# Patient Record
Sex: Male | Born: 1950 | Race: White | Hispanic: No | Marital: Married | Smoking: Never smoker
Health system: Southern US, Community
[De-identification: ages and names within clinical notes are randomized; demographics above are authoritative.]

## PROBLEM LIST (undated history)

## (undated) DIAGNOSIS — G4733 Obstructive sleep apnea (adult) (pediatric): Secondary | ICD-10-CM

## (undated) DIAGNOSIS — E785 Hyperlipidemia, unspecified: Secondary | ICD-10-CM

## (undated) DIAGNOSIS — M1990 Unspecified osteoarthritis, unspecified site: Secondary | ICD-10-CM

## (undated) DIAGNOSIS — I10 Essential (primary) hypertension: Secondary | ICD-10-CM

## (undated) DIAGNOSIS — Z8546 Personal history of malignant neoplasm of prostate: Secondary | ICD-10-CM

## (undated) DIAGNOSIS — N200 Calculus of kidney: Secondary | ICD-10-CM

## (undated) DIAGNOSIS — F5104 Psychophysiologic insomnia: Secondary | ICD-10-CM

## (undated) DIAGNOSIS — F329 Major depressive disorder, single episode, unspecified: Secondary | ICD-10-CM

## (undated) DIAGNOSIS — Z8719 Personal history of other diseases of the digestive system: Secondary | ICD-10-CM

## (undated) DIAGNOSIS — F419 Anxiety disorder, unspecified: Secondary | ICD-10-CM

## (undated) DIAGNOSIS — E119 Type 2 diabetes mellitus without complications: Secondary | ICD-10-CM

## (undated) DIAGNOSIS — E7800 Pure hypercholesterolemia, unspecified: Secondary | ICD-10-CM

## (undated) DIAGNOSIS — F32A Depression, unspecified: Secondary | ICD-10-CM

## (undated) DIAGNOSIS — F988 Other specified behavioral and emotional disorders with onset usually occurring in childhood and adolescence: Secondary | ICD-10-CM

## (undated) DIAGNOSIS — Z9989 Dependence on other enabling machines and devices: Principal | ICD-10-CM

## (undated) DIAGNOSIS — K5790 Diverticulosis of intestine, part unspecified, without perforation or abscess without bleeding: Secondary | ICD-10-CM

## (undated) DIAGNOSIS — G629 Polyneuropathy, unspecified: Secondary | ICD-10-CM

## (undated) DIAGNOSIS — M797 Fibromyalgia: Secondary | ICD-10-CM

## (undated) HISTORY — DX: Diverticulosis of intestine, part unspecified, without perforation or abscess without bleeding: K57.90

## (undated) HISTORY — DX: Essential (primary) hypertension: I10

## (undated) HISTORY — DX: Calculus of kidney: N20.0

## (undated) HISTORY — DX: Other specified behavioral and emotional disorders with onset usually occurring in childhood and adolescence: F98.8

## (undated) HISTORY — DX: Personal history of malignant neoplasm of prostate: Z85.46

## (undated) HISTORY — DX: Unspecified osteoarthritis, unspecified site: M19.90

## (undated) HISTORY — DX: Anxiety disorder, unspecified: F41.9

## (undated) HISTORY — DX: Polyneuropathy, unspecified: G62.9

## (undated) HISTORY — DX: Personal history of other diseases of the digestive system: Z87.19

## (undated) HISTORY — DX: Hyperlipidemia, unspecified: E78.5

## (undated) HISTORY — DX: Fibromyalgia: M79.7

## (undated) HISTORY — DX: Pure hypercholesterolemia, unspecified: E78.00

## (undated) HISTORY — DX: Psychophysiologic insomnia: F51.04

## (undated) HISTORY — PX: TONSILLECTOMY AND ADENOIDECTOMY: SUR1326

## (undated) HISTORY — DX: Depression, unspecified: F32.A

## (undated) HISTORY — DX: Type 2 diabetes mellitus without complications: E11.9

## (undated) HISTORY — DX: Major depressive disorder, single episode, unspecified: F32.9

## (undated) HISTORY — PX: KNEE SURGERY: SHX244

## (undated) HISTORY — DX: Obstructive sleep apnea (adult) (pediatric): G47.33

## (undated) HISTORY — DX: Dependence on other enabling machines and devices: Z99.89

---

## 1977-02-16 HISTORY — PX: HEMORRHOID SURGERY: SHX153

## 1995-02-17 DIAGNOSIS — K5790 Diverticulosis of intestine, part unspecified, without perforation or abscess without bleeding: Secondary | ICD-10-CM

## 1995-02-17 DIAGNOSIS — Z8719 Personal history of other diseases of the digestive system: Secondary | ICD-10-CM

## 1995-02-17 HISTORY — DX: Personal history of other diseases of the digestive system: Z87.19

## 1995-02-17 HISTORY — DX: Diverticulosis of intestine, part unspecified, without perforation or abscess without bleeding: K57.90

## 1996-02-17 HISTORY — PX: CARPAL TUNNEL RELEASE: SHX101

## 1998-07-21 ENCOUNTER — Ambulatory Visit: Admission: RE | Admit: 1998-07-21 | Discharge: 1998-07-21 | Payer: Self-pay | Admitting: Otolaryngology

## 2007-02-17 HISTORY — PX: PROSTATECTOMY: SHX69

## 2008-02-17 HISTORY — PX: LITHOTRIPSY: SUR834

## 2010-10-21 ENCOUNTER — Other Ambulatory Visit: Payer: Self-pay | Admitting: Emergency Medicine

## 2010-10-21 DIAGNOSIS — M79659 Pain in unspecified thigh: Secondary | ICD-10-CM

## 2010-10-28 ENCOUNTER — Ambulatory Visit
Admission: RE | Admit: 2010-10-28 | Discharge: 2010-10-28 | Disposition: A | Discharge status: Home / Self Care | Payer: BC Managed Care – PPO | Source: Ambulatory Visit | Attending: Emergency Medicine | Admitting: Emergency Medicine

## 2010-10-28 DIAGNOSIS — M79659 Pain in unspecified thigh: Secondary | ICD-10-CM

## 2010-10-29 ENCOUNTER — Other Ambulatory Visit: Payer: Self-pay | Admitting: Emergency Medicine

## 2010-10-29 DIAGNOSIS — M5410 Radiculopathy, site unspecified: Secondary | ICD-10-CM

## 2010-10-31 ENCOUNTER — Ambulatory Visit
Admission: RE | Admit: 2010-10-31 | Discharge: 2010-10-31 | Disposition: A | Discharge status: Home / Self Care | Payer: BC Managed Care – PPO | Source: Ambulatory Visit | Attending: Emergency Medicine | Admitting: Emergency Medicine

## 2010-10-31 DIAGNOSIS — M5410 Radiculopathy, site unspecified: Secondary | ICD-10-CM

## 2010-10-31 MED ORDER — IOHEXOL 180 MG/ML  SOLN
1.0000 mL | Freq: Once | INTRAMUSCULAR | Status: AC | PRN
  Administered 2010-10-31: 1 mL via EPIDURAL

## 2010-10-31 MED ORDER — METHYLPREDNISOLONE ACETATE 40 MG/ML INJ SUSP (RADIOLOG
120.0000 mg | Freq: Once | INTRAMUSCULAR | Status: AC
  Administered 2010-10-31: 120 mg via EPIDURAL

## 2010-12-30 ENCOUNTER — Other Ambulatory Visit: Payer: Self-pay | Admitting: Emergency Medicine

## 2010-12-30 DIAGNOSIS — M549 Dorsalgia, unspecified: Secondary | ICD-10-CM

## 2011-01-12 ENCOUNTER — Ambulatory Visit
Admission: RE | Admit: 2011-01-12 | Discharge: 2011-01-12 | Disposition: A | Discharge status: Home / Self Care | Payer: BC Managed Care – PPO | Source: Ambulatory Visit | Attending: Emergency Medicine | Admitting: Emergency Medicine

## 2011-01-12 DIAGNOSIS — M549 Dorsalgia, unspecified: Secondary | ICD-10-CM

## 2011-01-12 MED ORDER — METHYLPREDNISOLONE ACETATE 40 MG/ML INJ SUSP (RADIOLOG
120.0000 mg | Freq: Once | INTRAMUSCULAR | Status: AC
  Administered 2011-01-12: 120 mg via EPIDURAL

## 2011-01-12 MED ORDER — IOHEXOL 180 MG/ML  SOLN
1.0000 mL | Freq: Once | INTRAMUSCULAR | Status: AC | PRN
  Administered 2011-01-12: 1 mL via EPIDURAL

## 2011-03-04 ENCOUNTER — Encounter: Payer: Self-pay | Admitting: *Deleted

## 2011-03-05 ENCOUNTER — Encounter: Payer: Self-pay | Admitting: Cardiology

## 2011-03-05 ENCOUNTER — Ambulatory Visit (INDEPENDENT_AMBULATORY_CARE_PROVIDER_SITE_OTHER): Payer: 59 | Admitting: Cardiology

## 2011-03-05 VITALS — BP 121/77 | HR 77 | Ht 70.0 in | Wt 221.0 lb

## 2011-03-05 DIAGNOSIS — R899 Unspecified abnormal finding in specimens from other organs, systems and tissues: Secondary | ICD-10-CM

## 2011-03-05 DIAGNOSIS — R6889 Other general symptoms and signs: Secondary | ICD-10-CM

## 2011-03-05 DIAGNOSIS — E7800 Pure hypercholesterolemia, unspecified: Secondary | ICD-10-CM

## 2011-03-05 DIAGNOSIS — I10 Essential (primary) hypertension: Secondary | ICD-10-CM

## 2011-03-05 NOTE — Patient Instructions (Addendum)
Schedule an appointment for coronary calcium artery scoring CT at [facility].  Your physician recommends that you schedule a follow-up appointment in: 2-3 weeks with Dr Shirlee Latch after you have had the testing completed.

## 2011-03-05 NOTE — Assessment & Plan Note (Signed)
Bradley Medina has excellent LDL cholesterol but elevated myeloperoxidase level.  Cardiac risk factors include diabetes (diet-controlled), HTN, hyperlipidemia, and premature family history of CAD (mother in her 66s).  He does not have ischemic symptoms or exercise intolerance related to cardiopulmonary limitations.  Myeloperoxidase is a marker of vascular inflammation and oxidative stress and elevated levels have been linked to increased risk of CAD.  Given the lack of symptoms, there is no indication for a stress test.  I think that there are 2 mains questions here: 1) should he be on aspirin despite a mild to moderate decrease in platelets (could use ASA 81 mg every other day) and 2) should he be on a statin despite good LDL for its pleiotropic/anti-inflammatory properties.   - I will risk stratify with a coronary calcium score.  If his score is in the moderate to high risk for future events range, I will repeat a CBC and consider using a low dose ASA at 81 mg every other day if plts > 70K.  In that situation, I would start him on a statin regardless of the low LDL for its pleiotropic and anti-oxidative stress properties.  - Followup in 2 wks after calcium score.

## 2011-03-05 NOTE — Progress Notes (Signed)
PCP: Dr. Lorenz Coaster  61 yo with multiple cardiac risk factors including HTN, hyperlipidemia, and family history presents for cardiology evaluation.  His main symptomatic complaints seem to be related to chronic pain from his fibromyalgia.  He was noted by Dr. Lorenz Coaster to have a high myeloperoxidase level on lab work with his physical.  LDL was excellent at 73 and Lp(a) level was normal.  He denies exertional chest pain or dyspnea.  He is not, however, particularly active.  He had a stress test back in the 1990s but does not remember why or what the result was.  ECG from Dr. Melanee Spry office was normal.  He reports occasional "dizziness" with standing which sounds like orthostatic lightheadedness.  We checked orthostatics in the office today: he is not orthostatic.  He is not on ASA because of chronic mild to moderate thrombocytopenia.  He was on Crestor in the past bue is now Niaspan and fenofibrate.  He did not have side effects with Crestor that he can remember.  His triglycerides have been very high in the past.   ECG: NSR, normal  Labs (12/12): K 4, creatinine 1.22, HCT 40, platelets clumped on this CBC, ALT 48, AST 50, myeloperoxidase level 653 (normal < 488), TSH normal, LDL 73, HDL 30, Lp(a) 4.    PMH: 1. HTN 2. Hyperlipidemia: hypertriglyceridemia 3. Fibromyalgia 4. OSA: On CPAP 5. Anxiety 6. ADHD 7. Depression 8. Peripheral neuropathy 9. Diabetes mellitus: Diet-controlled.  10. Low back pain 11. Chronic thrombocytopenia of uncertain etiology.  Usually in the 60K-70K region.   12. Prostate cancer: s/p surgical resection. 13. Diverticulitis 14. Nephrolithiasis  FH: Mother with CABG in her 86s, father with CABG in his 14s.  SH: Married, works part time at [employer].  Lives in [place].  Nonsmoker.   ROS: All systems reviewed and negative except as per HPI.   Current Outpatient Prescriptions  Medication Sig Dispense Refill  . amphetamine-dextroamphetamine (ADDERALL XR) 30 MG 24  hr capsule Take 30 mg by mouth every morning.      . carvedilol (COREG) 6.25 MG tablet Take 3.125 mg by mouth 2 (two) times daily with a meal.      . celecoxib (CELEBREX) 200 MG capsule Take 200 mg by mouth 2 (two) times daily as needed.      . DULoxetine (CYMBALTA) 20 MG capsule Take 20 mg by mouth 2 (two) times daily.      . ergocalciferol (VITAMIN D2) 50000 UNITS capsule Take 50,000 Units by mouth once a week.      . fenofibrate micronized (LOFIBRA) 200 MG capsule Take 200 mg by mouth daily before breakfast.      . fexofenadine (ALLEGRA) 180 MG tablet Take 180 mg by mouth daily.      Marland Kitchen L-Methylfolate-B6-B12 (METANX PO) Take 1 tablet by mouth 2 (two) times daily.      Marland Kitchen losartan-hydrochlorothiazide (HYZAAR) 100-25 MG per tablet Take 1 tablet by mouth daily.      . niacin (NIASPAN) 1000 MG CR tablet Take 1,000 mg by mouth every morning.      . pregabalin (LYRICA) 100 MG capsule Take 100 mg by mouth 2 (two) times daily.      Marland Kitchen rOPINIRole (REQUIP) 0.5 MG tablet Take 1 tablet by mouth Daily.       BP 121/77  Pulse 77  Ht 5\' 10"  (1.778 m)  Wt 100.245 kg (221 lb)  BMI 31.71 kg/m2 General: NAD Neck: No JVD, no thyromegaly or thyroid nodule.  Lungs:  Clear to auscultation bilaterally with normal respiratory effort. CV: Nondisplaced PMI.  Heart regular S1/S2, no S3/S4, no murmur.  No peripheral edema.  No carotid bruit.  Normal pedal pulses.  Abdomen: Soft, nontender, no hepatosplenomegaly, no distention.  Skin: Intact without lesions or rashes.  Neurologic: Alert and oriented x 3.  Psych: Somewhat flat affect. Extremities: No clubbing or cyanosis.  HEENT: Normal.

## 2011-03-09 ENCOUNTER — Encounter: Payer: Self-pay | Admitting: *Deleted

## 2011-03-18 ENCOUNTER — Ambulatory Visit (HOSPITAL_COMMUNITY)
Admission: RE | Admit: 2011-03-18 | Discharge: 2011-03-18 | Disposition: A | Discharge status: Home / Self Care | Payer: Self-pay | Source: Ambulatory Visit | Attending: Cardiology | Admitting: Cardiology

## 2011-03-18 DIAGNOSIS — E7800 Pure hypercholesterolemia, unspecified: Secondary | ICD-10-CM

## 2011-03-18 DIAGNOSIS — I10 Essential (primary) hypertension: Secondary | ICD-10-CM

## 2011-03-18 DIAGNOSIS — I2510 Atherosclerotic heart disease of native coronary artery without angina pectoris: Secondary | ICD-10-CM | POA: Insufficient documentation

## 2011-03-19 ENCOUNTER — Telehealth: Payer: Self-pay | Admitting: Cardiology

## 2011-03-19 NOTE — Telephone Encounter (Signed)
Pt rtn call to [facility]

## 2011-03-19 NOTE — Telephone Encounter (Signed)
Patient aware of Calcium score results.

## 2011-04-07 ENCOUNTER — Ambulatory Visit (INDEPENDENT_AMBULATORY_CARE_PROVIDER_SITE_OTHER): Payer: 59 | Admitting: Cardiology

## 2011-04-07 ENCOUNTER — Encounter: Payer: Self-pay | Admitting: Cardiology

## 2011-04-07 DIAGNOSIS — R079 Chest pain, unspecified: Secondary | ICD-10-CM

## 2011-04-07 DIAGNOSIS — I2510 Atherosclerotic heart disease of native coronary artery without angina pectoris: Secondary | ICD-10-CM

## 2011-04-07 MED ORDER — ROSUVASTATIN CALCIUM 10 MG PO TABS: 10.0000 mg | ORAL_TABLET | Freq: Every day | ORAL | Status: DC

## 2011-04-07 NOTE — Patient Instructions (Signed)
Take aspirin 81mg  every other day.  Start Crestor 10mg  daily.  Your physician recommends that you have  lab work today--CBC   Your physician has requested that you have en exercise stress myoview. For further information please visit [email or web address] Please follow instruction sheet, as given.  Your physician recommends that you return for a FASTING lipid profile /liver profile /BMET in 2 months.  Your physician wants you to follow-up in: 6 months with Dr Shirlee Latch. (Augsut 2013) You will receive a reminder letter in the mail two months in advance. If you don't receive a letter, please call our office to schedule the follow-up appointment.

## 2011-04-08 DIAGNOSIS — I2510 Atherosclerotic heart disease of native coronary artery without angina pectoris: Secondary | ICD-10-CM | POA: Insufficient documentation

## 2011-04-08 LAB — CBC WITH DIFFERENTIAL/PLATELET
Basophils Absolute: 0 K/uL (ref 0.0–0.1)
Basophils Relative: 0.2 % (ref 0.0–3.0)
Eosinophils Absolute: 0 K/uL (ref 0.0–0.7)
Eosinophils Relative: 0.5 % (ref 0.0–5.0)
HCT: 42 % (ref 39.0–52.0)
Hemoglobin: 14.8 g/dL (ref 13.0–17.0)
Lymphocytes Relative: 16.1 % (ref 12.0–46.0)
Lymphs Abs: 1 K/uL (ref 0.7–4.0)
MCHC: 35.2 g/dL (ref 30.0–36.0)
MCV: 93.9 fl (ref 78.0–100.0)
Monocytes Absolute: 0.4 K/uL (ref 0.1–1.0)
Monocytes Relative: 6.5 % (ref 3.0–12.0)
Neutro Abs: 4.7 K/uL (ref 1.4–7.7)
Neutrophils Relative %: 76.7 % (ref 43.0–77.0)
Platelets: 50 K/uL — ABNORMAL LOW (ref 150.0–400.0)
RBC: 4.47 Mil/uL (ref 4.22–5.81)
RDW: 12.7 % (ref 11.5–14.6)
WBC: 6.1 K/uL (ref 4.5–10.5)

## 2011-04-08 NOTE — Progress Notes (Signed)
PCP: Dr. Lorenz Coaster  61 yo with multiple cardiac risk factors including HTN, hyperlipidemia, and family history returns for cardiology evaluation.  At last appointment, I set him up for a calcium score.  This was a high risk study, with score of 676 Agatston units and heavy calcium in the proximal LAD and proximal CFX.  At last appointment, he denied any significant exertional symptoms.  Today, on closer questioning, he denies chest pain but does report pain between his shoulder blades with heavy exertion: lifting heavy boxes at work or walking a long distance in the woods while hunting.  The pain will resolve with rest.  This has been present for at least a year.  He has chronic exertional dyspnea after walking about 1 block or climbing a flight of steps.  This tends to be relatively mild.    ECG: NSR, normal  Labs (12/12): K 4, creatinine 1.22, HCT 40, platelets clumped on this CBC, ALT 48, AST 50, myeloperoxidase level 653 (normal < 488), TSH normal, LDL 73, HDL 30, Lp(a) 4.    PMH: 1. HTN 2. Hyperlipidemia: hypertriglyceridemia 3. Fibromyalgia 4. OSA: On CPAP 5. Anxiety 6. ADHD 7. Depression 8. Peripheral neuropathy 9. Diabetes mellitus: Diet-controlled.  10. Low back pain 11. Chronic thrombocytopenia of uncertain etiology.  Usually in the 60K-70K region.   12. Prostate cancer: s/p surgical resection. 13. Diverticulitis 14. Nephrolithiasis 15. CAD: coronary artery calcium score high risk 1/13 with 670 Agatston units in proximal LAD and proximal CFX.    FH: Mother with CABG in her 26s, father with CABG in his 47s.  SH: Married, works part time at [employer].  Lives in [place].  Nonsmoker.   ROS: All systems reviewed and negative except as per HPI.   Current Outpatient Prescriptions  Medication Sig Dispense Refill  . acetaminophen (TYLENOL) 500 MG tablet Take 500 mg by mouth every 6 (six) hours as needed.      Marland Kitchen amphetamine-dextroamphetamine (ADDERALL XR) 30 MG 24 hr capsule  Take 30 mg by mouth every morning.      . carvedilol (COREG) 6.25 MG tablet Take 3.125 mg by mouth 2 (two) times daily with a meal.      . celecoxib (CELEBREX) 200 MG capsule Take 200 mg by mouth daily.       . DULoxetine (CYMBALTA) 20 MG capsule Take 20 mg by mouth daily.       . ergocalciferol (VITAMIN D2) 50000 UNITS capsule Take 50,000 Units by mouth once a week.      . fenofibrate micronized (LOFIBRA) 200 MG capsule Take 200 mg by mouth daily before breakfast.      . fexofenadine (ALLEGRA) 180 MG tablet Take 180 mg by mouth daily.      Marland Kitchen L-Methylfolate-B6-B12 (METANX PO) Take 1 tablet by mouth 2 (two) times daily.      Marland Kitchen losartan-hydrochlorothiazide (HYZAAR) 100-25 MG per tablet Take 1 tablet by mouth daily.      . niacin (NIASPAN) 1000 MG CR tablet Take 1,000 mg by mouth every morning.      . pregabalin (LYRICA) 100 MG capsule Take 100 mg by mouth daily.       Marland Kitchen aspirin (ASPIRIN CHILDRENS) 81 MG chewable tablet Take one every other day      . rosuvastatin (CRESTOR) 10 MG tablet Take 1 tablet (10 mg total) by mouth at bedtime.  90 tablet  3   BP 110/68  Pulse 62  Ht 5\' 10"  (1.778 m)  Wt 217 lb  6.4 oz (98.612 kg)  BMI 31.19 kg/m2 General: NAD, overweight.  Neck: Thick, no JVD, no thyromegaly or thyroid nodule.  Lungs: Clear to auscultation bilaterally with normal respiratory effort. CV: Nondisplaced PMI.  Heart regular S1/S2, no S3/S4, no murmur.  No peripheral edema.  No carotid bruit.  Normal pedal pulses.  Abdomen: Soft, nontender, no hepatosplenomegaly, no distention.  Neurologic: Alert and oriented x 3.  Psych: Somewhat flat affect. Extremities: No clubbing or cyanosis.

## 2011-04-08 NOTE — Assessment & Plan Note (Signed)
Bradley Medina's coronary artery calcium score places him in a high risk cohort. A calcium score > 400 Agatston units suggests elevated risk of obstructive CAD.  He has pain between his shoulder blades with heavy exertion that resolves with rest.  This certainly could be anginal pain.  This is stable.  I am going to get an ETT-myoview to assess the degree of ischemia.  If there is significant ischemia, he will need a cath.  I am going to start him on aspirin 81 mg every other day for now (given history of thrombocytopenia).  I will get a CBC today to follow his platelets.  I am also going to start him back on Crestor 10 mg daily even though his LDL is already low (for its pleiotropic effects in the setting of significant CAD).  Lipids/LFTs in 2 months.

## 2011-04-13 ENCOUNTER — Telehealth: Payer: Self-pay | Admitting: Cardiology

## 2011-04-13 NOTE — Telephone Encounter (Signed)
Pt was notified.  

## 2011-04-13 NOTE — Telephone Encounter (Signed)
Mr Kobler wants to know if his platelet count was high enough to start the asa?

## 2011-04-13 NOTE — Telephone Encounter (Signed)
New Problem   Patient would like a return call from nurse on hm# [phone], concerning blood platelet count on CBC.

## 2011-04-13 NOTE — Telephone Encounter (Signed)
Stably low platelets.  Can start ASA 81 mg every other day.

## 2011-04-20 ENCOUNTER — Ambulatory Visit (HOSPITAL_COMMUNITY): Payer: 59 | Attending: Internal Medicine | Admitting: Radiology

## 2011-04-20 DIAGNOSIS — R0989 Other specified symptoms and signs involving the circulatory and respiratory systems: Secondary | ICD-10-CM | POA: Insufficient documentation

## 2011-04-20 DIAGNOSIS — R9439 Abnormal result of other cardiovascular function study: Secondary | ICD-10-CM

## 2011-04-20 DIAGNOSIS — R0602 Shortness of breath: Secondary | ICD-10-CM

## 2011-04-20 DIAGNOSIS — R002 Palpitations: Secondary | ICD-10-CM | POA: Insufficient documentation

## 2011-04-20 DIAGNOSIS — I10 Essential (primary) hypertension: Secondary | ICD-10-CM | POA: Insufficient documentation

## 2011-04-20 DIAGNOSIS — Z8249 Family history of ischemic heart disease and other diseases of the circulatory system: Secondary | ICD-10-CM | POA: Insufficient documentation

## 2011-04-20 DIAGNOSIS — E663 Overweight: Secondary | ICD-10-CM | POA: Insufficient documentation

## 2011-04-20 DIAGNOSIS — R000 Tachycardia, unspecified: Secondary | ICD-10-CM | POA: Insufficient documentation

## 2011-04-20 DIAGNOSIS — R0609 Other forms of dyspnea: Secondary | ICD-10-CM | POA: Insufficient documentation

## 2011-04-20 DIAGNOSIS — E785 Hyperlipidemia, unspecified: Secondary | ICD-10-CM | POA: Insufficient documentation

## 2011-04-20 DIAGNOSIS — R5381 Other malaise: Secondary | ICD-10-CM | POA: Insufficient documentation

## 2011-04-20 DIAGNOSIS — R079 Chest pain, unspecified: Secondary | ICD-10-CM | POA: Insufficient documentation

## 2011-04-20 DIAGNOSIS — M25519 Pain in unspecified shoulder: Secondary | ICD-10-CM | POA: Insufficient documentation

## 2011-04-20 DIAGNOSIS — I2510 Atherosclerotic heart disease of native coronary artery without angina pectoris: Secondary | ICD-10-CM | POA: Insufficient documentation

## 2011-04-20 DIAGNOSIS — I209 Angina pectoris, unspecified: Secondary | ICD-10-CM

## 2011-04-20 DIAGNOSIS — E119 Type 2 diabetes mellitus without complications: Secondary | ICD-10-CM | POA: Insufficient documentation

## 2011-04-20 MED ORDER — TECHNETIUM TC 99M TETROFOSMIN IV KIT
33.0000 | PACK | Freq: Once | INTRAVENOUS | Status: AC | PRN
  Administered 2011-04-20: 33 via INTRAVENOUS

## 2011-04-20 MED ORDER — TECHNETIUM TC 99M TETROFOSMIN IV KIT
11.0000 | PACK | Freq: Once | INTRAVENOUS | Status: AC | PRN
  Administered 2011-04-20: 11 via INTRAVENOUS

## 2011-04-20 NOTE — Progress Notes (Addendum)
[facility] SITE 3 NUCLEAR MED [address] [place] [address] [phone]  Cardiology Nuclear Med Study  ARSHAWN VALDEZ is a 61 y.o. male [id number] September 04, 1950   Nuclear Med Background Indication for Stress Test:  Evaluation for Ischemia and Abnormal Cardiac CT History:  ~15 OZH:YQMVHQ per patient; 03/18/11 Cardiac IO:[id number] calcification with Proximal LAD/Proximal CFX Stenosis, High Calcium Score Cardiac Risk Factors: Family History - CAD, Hypertension, Lipids, NIDDM and Overweight  Symptoms:  DOE, Fatigue, Palpitations, Rapid HR and  (B) Shoulder-Blade Pain with Exertion   Nuclear Pre-Procedure Caffeine/Decaff Intake:  None NPO After: 10:00pm   Lungs:  Clear. IV 0.9% NS with Angio Cath:  20g  IV Site: R Hand  IV Started by:  Cathlyn Parsons, RN  Chest Size (in):  44 Cup Size: n/a  Height: 5\' 10"  (1.778 m)  Weight:  214 lb (97.07 kg)  BMI:  Body mass index is 30.71 kg/(m^2). Tech Comments:  Coreg held x 24 hours    Nuclear Med Study 1 or 2 day study: 1 day  Stress Test Type:  Stress  Reading MD: Dietrich Pates, MD  Order Authorizing Provider: Marca Ancona, MD  Resting Radionuclide: Technetium 76m Tetrofosmin  Resting Radionuclide Dose: 11.0 mCi   Stress Radionuclide:  Technetium 51m Tetrofosmin  Stress Radionuclide Dose: 33.0 mCi           Stress Protocol Rest HR: 55 Stress HR: 139  Rest BP: Sitting:111/75  Standing:105/72 Stress BP: 199/57  Exercise Time (min): 7:00 METS: 8.5   Predicted Max HR: 159 bpm % Max HR: 87.42 bpm Rate Pressure Product: 32440   Dose of Adenosine (mg):  n/a Dose of Lexiscan: n/a mg  Dose of Atropine (mg): n/a Dose of Dobutamine: n/a mcg/kg/min (at max HR)  Stress Test Technologist: Smiley Houseman, CMA-N  Nuclear Technologist:  Domenic Polite, CNMT     Rest Procedure:  Myocardial perfusion imaging was performed at rest 45 minutes following the intravenous administration of Technetium 63m Tetrofosmin.  Rest ECG:  Nonspecific T-wave changes, lead III.  Stress Procedure:  The patient exercised for seven minutes on the treadmill utilizing the Bruce protocol.  The patient stopped due to fatigue and moderate dyspnea.   He denied any chest pain.  There were ST-T wave changes with frequent PVC's/couplets and occasional PAC's .  Technetium 81m Tetrofosmin was injected at peak exercise and myocardial perfusion imaging was performed after a brief delay.  Stress ECG: No significant change from baseline ECG  Note occasional couplet.  QPS Raw Data Images:  Images were motion corrected.  Soft tissue (diaphragm, subcutaneous fat) surround heart. Stress Images:  Normal homogeneous uptake in all areas of the myocardium. Rest Images:  Normal homogeneous uptake in all areas of the myocardium. Subtraction (SDS):  No evidence of ischemia. Transient Ischemic Dilatation (Normal <1.22):  0.92 Lung/Heart Ratio (Normal <0.45):  0.36  Quantitative Gated Spect Images QGS EDV:  89 ml QGS ESV:  34 ml QGS cine images:  NL LV Function; NL Wall Motion QGS EF: 62%  Impression Exercise Capacity:  Fair exercise capacity. BP Response:  Normal blood pressure response. Clinical Symptoms:  No chest pain. ECG Impression:  No significant ST segment change suggestive of ischemia.  Note occasional couplet. Comparison with Prior Nuclear Study: No images to compare  Overall Impression:  Normal stress nuclear study. and Low risk stress nuclear study.  LVEF 62%.    S   Normal perfusion images with no evidence for ischemia.  Has known  coronary disease by calcium score so need to continue aggressive risk factor management.  If he develops increased exertional symptoms, would need to consider cath.  [employer]

## 2011-04-23 NOTE — Progress Notes (Signed)
Pt.notified

## 2011-04-23 NOTE — Progress Notes (Signed)
LMTCB

## 2011-05-20 ENCOUNTER — Other Ambulatory Visit: Payer: Self-pay | Admitting: Emergency Medicine

## 2011-05-20 DIAGNOSIS — M549 Dorsalgia, unspecified: Secondary | ICD-10-CM

## 2011-05-26 ENCOUNTER — Ambulatory Visit
Admission: RE | Admit: 2011-05-26 | Discharge: 2011-05-26 | Disposition: A | Discharge status: Home / Self Care | Payer: 59 | Source: Ambulatory Visit | Attending: Emergency Medicine | Admitting: Emergency Medicine

## 2011-05-26 DIAGNOSIS — M549 Dorsalgia, unspecified: Secondary | ICD-10-CM

## 2011-05-26 MED ORDER — IOHEXOL 180 MG/ML  SOLN
1.0000 mL | Freq: Once | INTRAMUSCULAR | Status: AC | PRN
  Administered 2011-05-26: 1 mL via EPIDURAL

## 2011-05-26 MED ORDER — METHYLPREDNISOLONE ACETATE 40 MG/ML INJ SUSP (RADIOLOG
120.0000 mg | Freq: Once | INTRAMUSCULAR | Status: AC
  Administered 2011-05-26: 120 mg via EPIDURAL

## 2011-06-01 ENCOUNTER — Other Ambulatory Visit: Payer: 59

## 2014-01-15 ENCOUNTER — Ambulatory Visit (INDEPENDENT_AMBULATORY_CARE_PROVIDER_SITE_OTHER): Payer: Medicare Other | Admitting: Neurology

## 2014-01-15 ENCOUNTER — Encounter: Payer: Self-pay | Admitting: Neurology

## 2014-01-15 VITALS — BP 122/75 | HR 77 | Resp 14 | Ht 71.25 in | Wt 219.0 lb

## 2014-01-15 DIAGNOSIS — F32A Depression, unspecified: Secondary | ICD-10-CM | POA: Insufficient documentation

## 2014-01-15 DIAGNOSIS — G4710 Hypersomnia, unspecified: Secondary | ICD-10-CM

## 2014-01-15 DIAGNOSIS — Z9989 Dependence on other enabling machines and devices: Principal | ICD-10-CM

## 2014-01-15 DIAGNOSIS — G4730 Sleep apnea, unspecified: Secondary | ICD-10-CM

## 2014-01-15 DIAGNOSIS — F329 Major depressive disorder, single episode, unspecified: Secondary | ICD-10-CM | POA: Insufficient documentation

## 2014-01-15 DIAGNOSIS — G4733 Obstructive sleep apnea (adult) (pediatric): Secondary | ICD-10-CM

## 2014-01-15 HISTORY — DX: Obstructive sleep apnea (adult) (pediatric): G47.33

## 2014-01-15 NOTE — Progress Notes (Signed)
SLEEP MEDICINE CLINIC   Provider:  Larey Medina, M D  Referring Provider: No ref. provider found Primary Care Physician:  Bradley Simmer, MD  Chief Complaint  Patient presents with  . RV sleep    Rm 11, alone    HPI:  Bradley Medina is a 63 y.o. male  Is seen here as a referral originally  from Dr. Jake Medina, now Dr. Nilda Medina.  Last seen by [name] 12-21-2010 .   The patient returned from a stay in [place] 2 months ago where he was followed by Bradley Medina pulmonologist in [place],  [place]. He is associated with the [facility] clinic. Phone [phone].  Mr. leg, a left-handed Caucasian male had been seen by Bradley Medina prior to his retirement. I saw him in a sleep consultation on 04-01-11 and was able at the time to review his previous sleep study from the year 2000 during which he was diagnosed with severe apnea and titrated to CPAP at 8 cm water water. He had residual AHI eyes that were close to 10 per hour and still had a very high Epworth score. In addition he had restless leg syndrome and frequent PLM's were seen during the study. 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 He had noted that back pain affects the soundness of his sleep he  had significant nocturia. On 12-21-2010 he was referred to a re-titration by Dr. Erling Medina.At that time he was 63 years old and had been using CPAP at 7 cm water for almost 10 years. The patient endorsed the Epworth sleepiness score at 20 points the Becks inventory at 45 points very high and indicative of clinical depression at that time. The AHI was only 9.6 so his apnea was no longer considered severe. In supine the AHI was 16.6 and showed therefore a strong positional component. In rem it was 16.3 as well. PLM arousal index was 0 but limb movements were frequently noted. Heart rate was in the low normal sinus range.  We advised to resume CPAP at 8 cm water and follow clinical pathology persists sleep lab. For restless legs I had also  suggested to consider iron therapy and lab tests accordingly.   He brought his CPAP with him today, a download could not be obtained ( [employer]) .   The patient goes to bed regularly between 10 and 10:30 at night, he has variable sleep latency is between 15 minutes to an hour depending on the day. Usually within 1-2 hours after initially going to sleep he will wake up. He will go to the bathroom but it is not the bus from call that wakes him. He will once again wake up at around 3 and often takes until 4 AM to reinitiate sleep. From 4 to until 7:30. He wakes up spontaneously does not rely on an alarm that he feels not refreshed or restored. If he doesn't have to rise for appointments etc. he would like to stay in bed until 11 or even noon. He will drink 1 cup of coffee in the morning hours, he does know longer commute. He has rather irregular mealtimes, he often dozes off in the afternoon.  Review of Systems: Out of a complete 14 system review, the patient complains of only the following symptoms, and all other reviewed systems are negative.   Epworth score 17 , Fatigue severity score 55  , depression score 5 points.  On 01-15-14, this patient endorsed the Epworth sleepiness score at 17 points  and the fatigue severity scale at 55 points. Both are highly elevated. The radial system was further endorsed for allergies, aching muscles, joint pain, depression and anxiety, decreased energy, racing thoughts, dizziness, increasing thirst and feeling hot at night, easy bruising or bleeding, sleepiness, restless legs, fatigue blurred,  vision shortness of breath,  rash and itching.  His DME is in [place], but he intents to stay in [place] now, needs a new DME and sleep doctor.           History   Social History  . Marital Status: Divorced    Spouse Name: N/A    Number of Children: 3  . Years of Education: N/A   Occupational History  . [employer]   Social History Main Topics    . Smoking status: Never Smoker   . Smokeless tobacco: Never Used  . Alcohol Use: 0.0 oz/week    0 Not specified per week     Comment: occ  . Drug Use: No  . Sexual Activity: Not on file   Other Topics Concern  . Not on file   Social History Narrative   Right handed.  Married, 3 kids.  Caffeine 1 cups daily.  Retired.    Family History  Problem Relation Age of Onset  . Lung cancer Father 48  . Coronary artery disease Father   . Heart failure Mother   . Coronary artery disease Mother   . Hypertension Mother   . Hyperlipidemia Mother   . Heart attack Mother   . Diabetes type II Mother   . Hypertension Brother     3 brothers total  . Hyperlipidemia Brother   . Diabetes type II Brother   . Depression Brother   . Diabetes type II Sister     1sister  . Hyperthyroidism Sister   . Depression Sister   . Obesity Sister   . Multiple myeloma Son     1 son  . Uterine cancer Daughter     2 daughters total  . Depression Daughter     Past Medical History  Diagnosis Date  . HLD (hyperlipidemia)   . Kidney stones   . Fibromyalgia   . Osteoarthritis   . DM2 (diabetes mellitus, type 2)   . History of prostate cancer     AGE 1  . Diverticulosis 1997    history of  . History of pancreatitis 1997  . ADD (attention deficit disorder) without hyperactivity   . Chronic insomnia   . OSA (obstructive sleep apnea)   . Anxiety   . OSA on CPAP 01/15/2014    Past Surgical History  Procedure Laterality Date  . Prostatectomy  2009  . Tonsillectomy and adenoidectomy    . Carpal tunnel release  1998  . Hemorrhoid surgery  1979  . Lithotripsy  2010    Current Outpatient Prescriptions  Medication Sig Dispense Refill  . acetaminophen (TYLENOL) 500 MG tablet Take 500 mg by mouth every 6 (six) hours as needed.    Marland Kitchen augmented betamethasone dipropionate (DIPROLENE) 0.05 % ointment Apply topically.    . clonazePAM (KLONOPIN) 1 MG tablet Take 0.5 mg by mouth daily.     . fenofibrate  micronized (LOFIBRA) 200 MG capsule Take 200 mg by mouth daily before breakfast.    . fexofenadine (ALLEGRA) 180 MG tablet Take 180 mg by mouth as needed.     Marland Kitchen ketoconazole (NIZORAL) 2 % cream Apply topically.    Marland Kitchen losartan-hydrochlorothiazide (HYZAAR) 100-25  MG per tablet Take 1 tablet by mouth daily.    . Omega-3 Fatty Acids (FISH OIL) 600 MG CAPS Take 600 mg by mouth 2 (two) times daily.    . rosuvastatin (CRESTOR) 10 MG tablet Take 1 tablet (10 mg total) by mouth at bedtime. (Patient taking differently: Take 5 mg by mouth at bedtime. ) 90 tablet 3  . venlafaxine XR (EFFEXOR-XR) 150 MG 24 hr capsule Take 150 mg by mouth.     No current facility-administered medications for this visit.    Allergies as of 01/15/2014  . (No Known Allergies)    Vitals: BP 122/75 mmHg  Pulse 77  Resp 14  Ht 5' 11.25" (1.81 m)  Wt 219 lb (99.338 kg)  BMI 30.32 kg/m2 Last Weight:  Wt Readings from Last 1 Encounters:  01/15/14 219 lb (99.338 kg)       Last Height:   Ht Readings from Last 1 Encounters:  01/15/14 5' 11.25" (1.81 m)    Physical exam:  General: The patient is awake, alert and appears not in acute distress. The patient is well groomed. Head: Normocephalic, atraumatic. Neck is supple. Mallampati 3   neck circumference 17 . Nasal airflow unrestricted , TMJ is  evident . Retrognathia is not seen.  Full facial hair.  Cardiovascular:  Regular rate and rhythm, without  murmurs or carotid bruit, and without distended neck veins. Respiratory: Lungs are clear to auscultation. Skin:  Without evidence of edema, or rash Trunk: BMI is  elevated and patient  has normal posture.  Neurologic exam : The patient is awake and alert, oriented to place and time.   Memory subjective described as intact.  There is a normal attention span & concentration ability.   Speech is fluent with Dysphonia. Mood and affect are depressed.  Cranial nerves: Pupils are equal and briskly reactive to light.  Funduscopic exam without evidence of pallor or edema.  Extraocular movements  in vertical and horizontal planes intact and without nystagmus. Visual fields by finger perimetry are intact. Hearing to finger rub intact.  Facial sensation intact to fine touch. Facial motor strength is symmetric and tongue and uvula move midline.  Motor exam:   Normal tone, muscle bulk and symmetric,strength in all extremities.  Crepitation over both  shoulders with ROM.   Sensory:  Fine touch, pinprick and vibration-  Decreased sensation in both feet-   At times painful.  Proprioception is tested in the upper extremities only. This was normal.  Coordination: Rapid alternating movements in the fingers/hands is normal.  Gait and station: Patient walks without assistive device and is able unassisted to climb up to the exam table.  Strength within normal limits. Deep tendon reflexes: in the  upper and lower extremities are symmetric and intact.  Babinski maneuver response is  downgoing.   Assessment:  After physical and neurologic examination, review of laboratory studies, imaging, neurophysiology testing and pre-existing records, assessment is  1)  Arthritic changes, joint pain, muscle pain.  i doubt this is fibromyalgia.  2) history of OSA,  AHi was on 12-20-13 dependent on REM and supine position, overall AHi was 9.6 .  This may not explain his residual hypersomnia. I can not access data from the CPAP therapy.  3) persistent daytime sleepiness and high degree of fatigue, depression may play a role, but the OSA may not be optimally treated either.  Narcolepsy evaluation recommended for high Epworth.   The patient was advised of the nature of the diagnosed sleep disorder ,  the treatment options and risks for general a health and wellness arising from not treating the condition. Visit duration was 45 minutes.   Draw HLA narcolepsy panel.  Vivid dreams not reported. Need [facility] to send [place] a new download  yearly.   Plan:  Treatment plan and additional workup : Patient needs a local DME - [facility] may be serving him here and in [place].     Asencion Partridge Flynn Gwyn MD  01/15/2014

## 2014-01-15 NOTE — Addendum Note (Signed)
Addended by: Larey Seat on: 01/15/2014 02:41 PM   Modules accepted: Orders, Level of Service

## 2014-01-15 NOTE — Patient Instructions (Signed)

## 2014-12-26 ENCOUNTER — Other Ambulatory Visit: Payer: Self-pay | Admitting: Gastroenterology

## 2015-01-03 ENCOUNTER — Other Ambulatory Visit: Payer: Self-pay | Admitting: Gastroenterology

## 2015-01-03 DIAGNOSIS — D696 Thrombocytopenia, unspecified: Secondary | ICD-10-CM

## 2015-01-03 DIAGNOSIS — R197 Diarrhea, unspecified: Secondary | ICD-10-CM

## 2015-01-03 DIAGNOSIS — K5792 Diverticulitis of intestine, part unspecified, without perforation or abscess without bleeding: Secondary | ICD-10-CM

## 2015-01-18 ENCOUNTER — Ambulatory Visit
Admission: RE | Admit: 2015-01-18 | Discharge: 2015-01-18 | Disposition: A | Discharge status: Home / Self Care | Payer: Medicare Other | Source: Ambulatory Visit | Attending: Gastroenterology | Admitting: Gastroenterology

## 2015-01-18 DIAGNOSIS — R197 Diarrhea, unspecified: Secondary | ICD-10-CM

## 2015-01-18 DIAGNOSIS — D696 Thrombocytopenia, unspecified: Secondary | ICD-10-CM

## 2015-01-18 DIAGNOSIS — K5792 Diverticulitis of intestine, part unspecified, without perforation or abscess without bleeding: Secondary | ICD-10-CM

## 2015-04-22 ENCOUNTER — Ambulatory Visit: Payer: Self-pay | Admitting: Neurology

## 2015-04-30 ENCOUNTER — Encounter: Payer: Self-pay | Admitting: Neurology

## 2015-04-30 ENCOUNTER — Ambulatory Visit (INDEPENDENT_AMBULATORY_CARE_PROVIDER_SITE_OTHER): Payer: Medicare Other | Admitting: Neurology

## 2015-04-30 VITALS — BP 124/82 | HR 86 | Resp 20 | Ht 70.0 in | Wt 219.0 lb

## 2015-04-30 DIAGNOSIS — E0842 Diabetes mellitus due to underlying condition with diabetic polyneuropathy: Secondary | ICD-10-CM | POA: Diagnosis not present

## 2015-04-30 DIAGNOSIS — G47419 Narcolepsy without cataplexy: Secondary | ICD-10-CM | POA: Diagnosis not present

## 2015-04-30 DIAGNOSIS — G2581 Restless legs syndrome: Secondary | ICD-10-CM | POA: Diagnosis not present

## 2015-04-30 DIAGNOSIS — F339 Major depressive disorder, recurrent, unspecified: Secondary | ICD-10-CM | POA: Insufficient documentation

## 2015-04-30 DIAGNOSIS — G4733 Obstructive sleep apnea (adult) (pediatric): Secondary | ICD-10-CM | POA: Diagnosis not present

## 2015-04-30 DIAGNOSIS — Z9989 Dependence on other enabling machines and devices: Principal | ICD-10-CM

## 2015-04-30 MED ORDER — MODAFINIL 200 MG PO TABS: 200.0000 mg | ORAL_TABLET | Freq: Every day | ORAL | Status: DC

## 2015-04-30 NOTE — Patient Instructions (Signed)
Modafinil tablets What is this medicine? MODAFINIL (moe DAF i nil) is used to treat excessive sleepiness caused by certain sleep disorders. This includes narcolepsy, sleep apnea, and shift work sleep disorder. This medicine may be used for other purposes; ask your health care provider or pharmacist if you have questions. What should I tell my health care provider before I take this medicine? They need to know if you have any of these conditions: -history of depression, mania, or other mental disorder -kidney disease -liver disease -an unusual or allergic reaction to modafinil, other medicines, foods, dyes, or preservatives -pregnant or trying to get pregnant -breast-feeding How should I use this medicine? Take this medicine by mouth with a glass of water. Follow the directions on the prescription label. Take your doses at regular intervals. Do not take your medicine more often than directed. Do not stop taking except on your doctor's advice. A special MedGuide will be given to you by the pharmacist with each prescription and refill. Be sure to read this information carefully each time. Talk to your pediatrician regarding the use of this medicine in children. This medicine is not approved for use in children. Overdosage: If you think you have taken too much of this medicine contact a poison control center or emergency room at once. NOTE: This medicine is only for you. Do not share this medicine with others. What if I miss a dose? If you miss a dose, take it as soon as you can. If it is almost time for your next dose, take only that dose. Do not take double or extra doses. What may interact with this medicine? Do not take this medicine with any of the following medications: -amphetamine or dextroamphetamine -dexmethylphenidate or methylphenidate -medicines called MAO Inhibitors like Nardil, Parnate, Marplan, Eldepryl -pemoline -procarbazine This medicine may also interact with the following  medications: -antifungal medicines like itraconazole or ketoconazole -barbiturates like phenobarbital -birth control pills or other hormone-containing birth control devices or implants -carbamazepine -cyclosporine -diazepam -medicines for depression, anxiety, or psychotic disturbances -phenytoin -propranolol -triazolam -warfarin This list may not describe all possible interactions. Give your health care provider a list of all the medicines, herbs, non-prescription drugs, or dietary supplements you use. Also tell them if you smoke, drink alcohol, or use illegal drugs. Some items may interact with your medicine. What should I watch for while using this medicine? Visit your doctor or health care professional for regular checks on your progress. The full effects of this medicine may not be seen right away. This medicine may affect your concentration, function, or may hide signs that you are tired. You may get dizzy. Do not drive, use machinery, or do anything that needs mental alertness until you know how this drug affects you. Alcohol can make you more dizzy and may interfere with your response to this medicine or your alertness. Avoid alcoholic drinks. Birth control pills may not work properly while you are taking this medicine. Talk to your doctor about using an extra method of birth control. It is unknown if the effects of this medicine will be increased by the use of caffeine. Caffeine is available in many foods, beverages, and medications. Ask your doctor if you should limit or change your intake of caffeine-containing products while on this medicine. What side effects may I notice from receiving this medicine? Side effects that you should report to your doctor or health care professional as soon as possible: -allergic reactions like skin rash, itching or hives, swelling of the face,   lips, or tongue -anxiety -breathing problems -chest pain -fast, irregular  heartbeat -hallucinations -increased blood pressure -redness, blistering, peeling or loosening of the skin, including inside the mouth -sore throat, fever, or chills -suicidal thoughts or other mood changes -tremors -vomiting Side effects that usually do not require medical attention (report to your doctor or health care professional if they continue or are bothersome): -headache -nausea, diarrhea, or stomach upset -nervousness -trouble sleeping This list may not describe all possible side effects. Call your doctor for medical advice about side effects. You may report side effects to FDA at 1-800-FDA-1088. Where should I keep my medicine? Keep out of the reach of children. This medicine can be abused. Keep your medicine in a safe place to protect it from theft. Do not share this medicine with anyone. Selling or giving away this medicine is dangerous and against the law. This medicine may cause accidental overdose and death if taken by other adults, children, or pets. Mix any unused medicine with a substance like cat litter or coffee grounds. Then throw the medicine away in a sealed container like a sealed bag or a coffee can with a lid. Do not use the medicine after the expiration date. Store at room temperature between 20 and 25 degrees C (68 and 77 degrees F). NOTE: This sheet is a summary. It may not cover all possible information. If you have questions about this medicine, talk to your doctor, pharmacist, or health care provider.    2016, Elsevier/Gold Standard. (2013-10-24 15:34:55)  

## 2015-04-30 NOTE — Addendum Note (Signed)
Addended by: Larey Seat on: 04/30/2015 02:40 PM   Modules accepted: Orders

## 2015-04-30 NOTE — Progress Notes (Signed)
SLEEP MEDICINE CLINIC   Provider:  Larey Seat, M D  Referring Provider: Delilah Shan, MD Primary Care Physician:  Nilda Simmer, MD  Chief Complaint  Patient presents with  . Follow-up    needs a new cpap machine, cpap machine "going off" during the night, positive HLA from 2015 which appears to be never followed up on    HPI:  Bradley Medina is a 65 y.o. male  Is seen here as a referral originally  from Dr. Jake Michaelis, now Dr. Nilda Simmer. 05-01-2015.  Interval history from 04/30/2015. Bradley Medina has meanwhile moved full-time back to [place], [place]. 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 This is the explanation why this patient remains excessively daytime sleepy but being compliant with CPAP. He also has symptoms of clinical depression. He endorsed the geriatric depression score at 14 out of 15 points. His obstructive sleep apnea is sufficiently treated with CPAP he is using a REM Star device by [employer], had a 96.7% percentage compliance and 93.3% compliance for daily hours of use over 4 hours. Average user time is 7 hours 50 minutes average AHI is 5.5 CPAP is set at 8 cm water pressure. The needs to be no adjustments made to his CPAP. He remains in spite of high compliance with CPAP excessively daytime sleepy as reflected in an Epworth score of 22 points, fatigue severity score 63 points. Both, narcolepsy and depression can have an effect on fatigue and Epworth sleepiness score. I do not expect that the sleep apnea would still cause sleepiness -given that is so well controlled. RLS well controlled.  Mr. leg is still treated with venlafaxine generic form of Effexor extended release 150,000,000 g daily and he takes Requip 3 mg at night for restless legs. There are no other REM suppressant or REM affecting medications. The difficulty is that an MS LT-polysomnogram to establish narcolepsy relies on REM onset  periods, which are suppressed on antidepressant therapy. Therefore venlafaxine would not allow a meaningful M SLT. He can not recall having ever tried modafinil. That should be our first approach. He reports no cataplexy. His wife drives , doesn't trust him and his drowsiness .      Last seen by [facility] 12-21-2010 .  The patient returned from a stay in [place] 2 months ago where he was followed by Marnette Burgess, MD  pulmonologist in [place],  [place]. He is associated with the [facility] clinic. Phone [phone]. Bradley Medina, a left-handed Caucasian male, had been seen by Dr. love prior to his retirement. I saw him in a sleep consultation on 04-01-11 and was able at the time to review his previous sleep study from the year 2000 during which he was diagnosed with severe apnea and titrated to CPAP at 8 cm water water. He had residual AHI eyes that were close to 10 per hour and still had a very high Epworth score. In addition he had restless leg syndrome and frequent PLM's were seen during the study. 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 He had noted that back pain affects the soundness of his sleep he  had significant nocturia. On 12-21-2010 he was referred to a re-titration by Dr. Erling Cruz.At that time he was 65 years old and had been using CPAP at 7 cm water for almost 10 years. The patient endorsed the Epworth sleepiness score at 20 points the Becks inventory at  45 points very high and indicative of clinical depression at that time.  The AHI was only 9.6 so his apnea was no longer considered severe. In supine the AHI was 16.6 and showed therefore a strong positional component. In rem it was 16.3 as well. PLM arousal index was 0 but limb movements were frequently noted. Heart rate was in the low normal sinus range. We advised to resume CPAP at 8 cm water and follow clinical pathology persists sleep lab.   For restless legs I had also suggested to consider iron therapy and lab  tests accordingly.  The patient goes to bed regularly between 10 and 10:30 at night, he has variable sleep latency is between 15 minutes to an hour depending on the day. Usually within 1-2 hours after initially going to sleep he will wake up. He will go to the bathroom but it is not the bus from call that wakes him. He will once again wake up at around 3 and often takes until 4 AM to reinitiate sleep. From 4 to until 7:30. He wakes up spontaneously does not rely on an alarm that he feels not refreshed or restored. If he doesn't have to rise for appointments etc. he would like to stay in bed until 11 or even noon. He will drink 1 cup of coffee in the morning hours, he does know longer commute. He has rather irregular mealtimes, he often dozes off in the afternoon.  Review of Systems: Out of a complete 14 system review, the patient complains of only the following symptoms, and all other reviewed systems are negative.   Epworth score 22 from 17 , Fatigue severity score 63 from 55  , depression score 14 out of 15 points.  further endorsed for allergies, aching muscles, joint pain, depression and anxiety, decreased energy, racing thoughts, dizziness, increasing thirst and feeling hot at night, easy bruising or bleeding, sleepiness, restless legs, fatigue blurred,  vision shortness of breath,  rash and itching.  His DME is in [place], but he intents to stay in [place] now, needs a new DME and sleep doctor.   Caffeine - one cup a day. No soda and no iced tea.    Social History   Social History  . Marital Status: Divorced    Spouse Name: N/A  . Number of Children: 3  . Years of Education: N/A   Occupational History  . [employer]   Social History Main Topics  . Smoking status: Never Smoker   . Smokeless tobacco: Never Used  . Alcohol Use: 0.0 oz/week    0 Standard drinks or equivalent per week     Comment: occ  . Drug Use: No  . Sexual Activity: Not on file   Other Topics  Concern  . Not on file   Social History Narrative   Right handed.  Married, 3 kids.  Caffeine 1 cups daily.  Retired.    Family History  Problem Relation Age of Onset  . Lung cancer Father 56  . Coronary artery disease Father   . Heart failure Mother   . Coronary artery disease Mother   . Hypertension Mother   . Hyperlipidemia Mother   . Heart attack Mother   . Diabetes type II Mother   . Hypertension Brother     3 brothers total  . Hyperlipidemia Brother   . Diabetes type II Brother   . Depression Brother   . Diabetes type II Sister  1sister  . Hyperthyroidism Sister   . Depression Sister   . Obesity Sister   . Multiple myeloma Son     1 son  . Uterine cancer Daughter     2 daughters total  . Depression Daughter     Past Medical History  Diagnosis Date  . HLD (hyperlipidemia)   . Kidney stones   . Fibromyalgia   . Osteoarthritis   . DM2 (diabetes mellitus, type 2) ([place])   . History of prostate cancer     AGE 87  . Diverticulosis 1997    history of  . History of pancreatitis 1997  . ADD (attention deficit disorder) without hyperactivity   . Chronic insomnia   . OSA (obstructive sleep apnea)   . Anxiety   . OSA on CPAP 01/15/2014  . Neuropathy ([place])   . Depression   . Hypercholesterolemia   . Hypertension     Past Surgical History  Procedure Laterality Date  . Prostatectomy  2009  . Tonsillectomy and adenoidectomy    . Carpal tunnel release  1998  . Hemorrhoid surgery  1979  . Lithotripsy  2010  . Knee surgery      Current Outpatient Prescriptions  Medication Sig Dispense Refill  . acetaminophen (TYLENOL) 500 MG tablet Take 500 mg by mouth every 6 (six) hours as needed.    . fenofibrate micronized (LOFIBRA) 200 MG capsule Take 200 mg by mouth daily before breakfast.    . fexofenadine (ALLEGRA) 180 MG tablet Take 180 mg by mouth as needed.     Marland Kitchen losartan-hydrochlorothiazide (HYZAAR) 100-25 MG per tablet Take 1 tablet by mouth daily.    .  metFORMIN (GLUCOPHAGE) 500 MG tablet Take 500 mg by mouth.    . Omega-3 Fatty Acids (FISH OIL) 600 MG CAPS Take 600 mg by mouth 2 (two) times daily.    . pioglitazone (ACTOS) 15 MG tablet Take 15 mg by mouth.    Marland Kitchen rOPINIRole (REQUIP) 3 MG tablet Take 3 mg by mouth.    . venlafaxine XR (EFFEXOR-XR) 150 MG 24 hr capsule Take 150 mg by mouth.    . rosuvastatin (CRESTOR) 10 MG tablet Take 1 tablet (10 mg total) by mouth at bedtime. (Patient taking differently: Take 5 mg by mouth at bedtime. ) 90 tablet 3   No current facility-administered medications for this visit.    Allergies as of 04/30/2015  . (No Known Allergies)    Vitals: BP 124/82 mmHg  Pulse 86  Resp 20  Ht '5\' 10"'  (1.778 m)  Wt 219 lb (99.338 kg)  BMI 31.42 kg/m2 Last Weight:  Wt Readings from Last 1 Encounters:  04/30/15 219 lb (99.338 kg)       Last Height:   Ht Readings from Last 1 Encounters:  04/30/15 '5\' 10"'  (1.778 m)    Physical exam:  General: The patient is awake, alert and appears not in acute distress. The patient is well groomed. Head: Normocephalic, atraumatic. Neck is supple. Mallampati 3   neck circumference 17 . Nasal airflow unrestricted , TMJ is  evident . Retrognathia is not seen.  Full facial hair.  Cardiovascular:  Regular rate and rhythm, without  murmurs or carotid bruit, and without distended neck veins. Respiratory: Lungs are clear to auscultation. Skin:  Without evidence of edema, or rash Trunk: BMI is  elevated and patient  has normal posture.  Neurologic exam : The patient is awake and alert, oriented to place and time.   Memory subjective described as  intact.  There is a normal attention span & concentration ability. Speech is fluent with Dysphonia.Mood and affect are depressed.  Cranial nerves: Pupils are equal and briskly reactive to light. Funduscopic exam without evidence of pallor or edema.  Extraocular movements  in vertical and horizontal planes intact and without nystagmus. Visual  fields by finger perimetry are intact. Hearing to finger rub intact.  Facial sensation intact to fine touch. Facial motor strength is symmetric and tongue and uvula move midline.  Motor exam:   Normal tone, muscle bulk and symmetric,strength in all extremities.  Crepitation over both  shoulders with ROM. Sensory:  Fine touch, pinprick and vibration- Decreased sensation in both feet- at times painful.  Proprioception is tested in the upper extremities only.This was normal. Coordination: Rapid alternating movements in the fingers/hands is normal.  Assessment:  After physical and neurologic examination, review of laboratory studies, imaging, neurophysiology testing and pre-existing records,  35 minute assessment is 50% of the time dedicated face to face to coordinate care and address the co-morbidities the patient presents with.   1) Depression , major and not sufficiently addressed on Effexor alone. The geriatric depression score is 14 out of 15 possible points. This could contribute to the feeling of hopelessness, fatigue, lack of energy. However given the positive HLA test I doubt that this is the reason for excessive daytime sleepiness.  2) History of OSA, AHi was 5.5 on CPAP 8 cm water.    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 recommended for high Epworth.   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 An MS LT cannot be obtained for the patient is on Effexor, I will suggest that we treat him with modafinil under the label of residual sleepiness while on CPAP. The patient feels that this is not just gives him energy may also lift part of the depression.  The patient was advised of the nature of the diagnosed sleep disorder , the treatment options and risks for general a health and wellness arising from not  treating the condition. Visit duration was 45 minutes.   Draw HLA narcolepsy panel.  Vivid dreams - dream content continues over several episodes, but cataplexy, sleep paralysis not reported .  Plan:  Treatment plan and additional workup : Patient needs a local DME -[facility] here in [place] follows.  Referral to depression treatment with Dr.Plovsky, MD Initiate Modafinil for EDS, Narcolepsy and OSA , monitor blood pressure.  MSLT discussed, is not possible on current REM suppressant medications. RV in 6 month for refills, CPAP once a year, may see NP>      Larey Seat MD  04/30/2015

## 2015-05-07 ENCOUNTER — Encounter: Payer: Self-pay | Admitting: *Deleted

## 2015-06-13 ENCOUNTER — Telehealth: Payer: Self-pay | Admitting: Neurology

## 2015-06-13 NOTE — Telephone Encounter (Signed)
PA for modafinil was approved by [employer]. Effective date: 02/15/2015 to 02/16/2016. Ref #[id number]  I spoke to pt and advised him of this. Pt verbalized understanding.  Will fax approval letter to [facility].

## 2015-06-13 NOTE — Telephone Encounter (Signed)
Patient called to request nurse call Holland Falling [phone] to give PA for modafinil (PROVIGIL) 200 MG tablet

## 2015-06-13 NOTE — Telephone Encounter (Signed)
I completed [employer] medicare's pa for modafinil. Should hear back a determination in 1-5 business days.

## 2015-08-15 ENCOUNTER — Telehealth: Payer: Self-pay

## 2015-08-15 NOTE — Telephone Encounter (Signed)
I spoke to pt's wife, Vaughan Basta, per DPR, and advised her that pt's appt on 9/14 with Jinny Blossom, NP needs to be rescheduled. Pt's wife is agreeable to 9/21 at 9:30. Pt's wife verbalized understanding of new appt date and time.

## 2015-08-29 ENCOUNTER — Other Ambulatory Visit: Payer: Self-pay | Admitting: Orthopaedic Surgery

## 2015-08-29 DIAGNOSIS — M5412 Radiculopathy, cervical region: Secondary | ICD-10-CM

## 2015-08-29 DIAGNOSIS — M545 Low back pain: Secondary | ICD-10-CM

## 2015-09-12 ENCOUNTER — Ambulatory Visit
Admission: RE | Admit: 2015-09-12 | Discharge: 2015-09-12 | Disposition: A | Discharge status: Home / Self Care | Payer: Medicare Other | Source: Ambulatory Visit | Attending: Orthopaedic Surgery | Admitting: Orthopaedic Surgery

## 2015-09-12 ENCOUNTER — Ambulatory Visit
Admission: RE | Admit: 2015-09-12 | Discharge: 2015-09-12 | Disposition: A | Discharge status: Home / Self Care | Payer: Self-pay | Source: Ambulatory Visit | Attending: Orthopaedic Surgery | Admitting: Orthopaedic Surgery

## 2015-09-12 DIAGNOSIS — M545 Low back pain: Secondary | ICD-10-CM

## 2015-09-12 DIAGNOSIS — M5412 Radiculopathy, cervical region: Secondary | ICD-10-CM

## 2015-10-31 ENCOUNTER — Ambulatory Visit: Payer: Medicare Other | Admitting: Adult Health

## 2015-11-07 ENCOUNTER — Telehealth: Payer: Self-pay | Admitting: Adult Health

## 2015-11-07 ENCOUNTER — Encounter: Payer: Self-pay | Admitting: Adult Health

## 2015-11-07 ENCOUNTER — Telehealth: Payer: Self-pay | Admitting: Neurology

## 2015-11-07 ENCOUNTER — Ambulatory Visit (INDEPENDENT_AMBULATORY_CARE_PROVIDER_SITE_OTHER): Payer: Medicare Other | Admitting: Adult Health

## 2015-11-07 ENCOUNTER — Other Ambulatory Visit: Payer: Self-pay | Admitting: Neurology

## 2015-11-07 ENCOUNTER — Ambulatory Visit
Admission: RE | Admit: 2015-11-07 | Discharge: 2015-11-07 | Disposition: A | Discharge status: Home / Self Care | Payer: Medicare Other | Source: Ambulatory Visit | Attending: Adult Health | Admitting: Adult Health

## 2015-11-07 ENCOUNTER — Telehealth: Payer: Self-pay | Admitting: *Deleted

## 2015-11-07 VITALS — BP 132/77 | HR 72 | Resp 16 | Ht 70.0 in | Wt 230.4 lb

## 2015-11-07 DIAGNOSIS — G4733 Obstructive sleep apnea (adult) (pediatric): Secondary | ICD-10-CM

## 2015-11-07 DIAGNOSIS — S0990XA Unspecified injury of head, initial encounter: Secondary | ICD-10-CM

## 2015-11-07 DIAGNOSIS — R930 Abnormal findings on diagnostic imaging of skull and head, not elsewhere classified: Secondary | ICD-10-CM

## 2015-11-07 DIAGNOSIS — Z9989 Dependence on other enabling machines and devices: Principal | ICD-10-CM

## 2015-11-07 DIAGNOSIS — G4719 Other hypersomnia: Secondary | ICD-10-CM | POA: Diagnosis not present

## 2015-11-07 MED ORDER — MODAFINIL 200 MG PO TABS: 200.0000 mg | ORAL_TABLET | Freq: Every day | ORAL | 0 refills | Status: DC

## 2015-11-07 NOTE — Telephone Encounter (Signed)
[facility] called with stat report. Cyril Mourning took call

## 2015-11-07 NOTE — Telephone Encounter (Signed)
RX printed on out normal paper twice. I reprinted for the 3rd time and it printed on RX paper.

## 2015-11-07 NOTE — Progress Notes (Signed)
PATIENT: Bradley Medina DOB: [birth year]  REASON FOR VISIT: follow up- OSA on CPAP, RLS, Narcolepsy HISTORY FROM: patient  HISTORY OF PRESENT ILLNESS: Bradley Medina is a 65 year old male with a history of obstructive sleep apnea on CPAP, narcolepsy and restless leg syndrome. He returns today for a compliance download. His download indicates that he uses machine 30 out of 30 days for compliance of 100%. He uses machine greater than 4 hours each night. On average he uses his machine 7 hours and 45 minutes. He is on a pressure of 8 cm of water with EPR 1. His residual AHI is 20.9. He does not have a significantly. He states that he has been using Provigil and it has been slightly beneficial. He denies starting any new medication. Reports that he has gained some weight since the last visit. He also reports that he's been waking up with a headache that he describes as a pressure sensation on the forehead, dizziness and disorientation. He states that this also started approximately a month ago when he had an injury to his forehead. He states that he was out "shooting a grounghog" and the scope of his bow popped him slightly above the eyebrows in the middle of his eyes. He states that he had 2 black eyes afterwards and a laceration. He has not sought any medical treatment after this event. He reports now that the symptoms have continued for the last month- he is concerned that in the past he's been told he has low platelets- he is concerned about any internal bleeding. He returns today for an evaluation.  HISTORY: Interval history from 04/30/2015. Mr. Scogin has meanwhile moved full-time back to [place], [place]. 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 This is the explanation why this patient remains excessively daytime sleepy but being compliant with CPAP. He also has symptoms of clinical depression. He endorsed the geriatric  depression score at 14 out of 15 points. His obstructive sleep apnea is sufficiently treated with CPAP he is using a REM Star device by [employer], had a 96.7% percentage compliance and 93.3% compliance for daily hours of use over 4 hours. Average user time is 7 hours 50 minutes average AHI is 5.5 CPAP is set at 8 cm water pressure. The needs to be no adjustments made to his CPAP. He remains in spite of high compliance with CPAP excessively daytime sleepy as reflected in an Epworth score of 22 points, fatigue severity score 63 points. Both, narcolepsy and depression can have an effect on fatigue and Epworth sleepiness score. I do not expect that the sleep apnea wou ld still cause sleepiness -given that is so well controlled. RLS well controlled.   REVIEW OF SYSTEMS: Out of a complete 14 system review of symptoms, the patient complains only of the following symptoms, and all other reviewed systems are negative.  Appetite change, fatigue, light sensitivity, cough, shortness of breath, heat intolerance, excessive thirst, excessive eating, incontinence of bowels, restless leg, apnea, daytime sleepiness, acting out dreams, wounds, itching, neck stiffness, neck pain, muscle cramps, aching muscles, back pain, joint pain, frequency of urination, incontinence of bladder, environmental allergies, bruise/bleed easily, dizziness, headache, numbness, weakness, confusion, decreased concentration, depression  Epworth sleepiness score 15 fatigue severity score 59  ALLERGIES: No Known Allergies  HOME MEDICATIONS: Outpatient Medications Prior to Visit  Medication Sig Dispense Refill  . acetaminophen (TYLENOL) 500 MG tablet Take 500 mg  by mouth every 6 (six) hours as needed.    . fenofibrate micronized (LOFIBRA) 200 MG capsule Take 200 mg by mouth daily before breakfast.    . fexofenadine (ALLEGRA) 180 MG tablet Take 180 mg by mouth as needed.     Marland Kitchen losartan-hydrochlorothiazide (HYZAAR) 100-25 MG per tablet  Take 1 tablet by mouth daily.    . metFORMIN (GLUCOPHAGE) 500 MG tablet Take 500 mg by mouth.    . modafinil (PROVIGIL) 200 MG tablet Take 1 tablet (200 mg total) by mouth daily. (Patient taking differently: Take 100 mg by mouth daily. ) 30 tablet 5  . Omega-3 Fatty Acids (FISH OIL) 600 MG CAPS Take 600 mg by mouth 2 (two) times daily.    . pioglitazone (ACTOS) 15 MG tablet Take 15 mg by mouth.    Marland Kitchen rOPINIRole (REQUIP) 3 MG tablet Take 3 mg by mouth.    . venlafaxine XR (EFFEXOR-XR) 150 MG 24 hr capsule Take 150 mg by mouth.    . rosuvastatin (CRESTOR) 10 MG tablet Take 1 tablet (10 mg total) by mouth at bedtime. (Patient taking differently: Take 5 mg by mouth at bedtime. ) 90 tablet 3   No facility-administered medications prior to visit.     PAST MEDICAL HISTORY: Past Medical History:  Diagnosis Date  . ADD (attention deficit disorder) without hyperactivity   . Anxiety   . Chronic insomnia   . Depression   . Diverticulosis 1997   history of  . DM2 (diabetes mellitus, type 2) ([place])   . Fibromyalgia   . History of pancreatitis 1997  . History of prostate cancer    AGE 11  . HLD (hyperlipidemia)   . Hypercholesterolemia   . Hypertension   . Kidney stones   . Neuropathy ([place])   . OSA (obstructive sleep apnea)   . OSA on CPAP 01/15/2014  . Osteoarthritis     PAST SURGICAL HISTORY: Past Surgical History:  Procedure Laterality Date  . CARPAL TUNNEL RELEASE  1998  . [place]  . KNEE SURGERY    . LITHOTRIPSY  2010  . PROSTATECTOMY  2009  . TONSILLECTOMY AND ADENOIDECTOMY      FAMILY HISTORY: Family History  Problem Relation Age of Onset  . Lung cancer Father 51  . Coronary artery disease Father   . Heart failure Mother   . Coronary artery disease Mother   . Hypertension Mother   . Hyperlipidemia Mother   . Heart attack Mother   . Diabetes type II Mother   . Hypertension Brother     3 brothers total  . Hyperlipidemia Brother   . Diabetes type II  Brother   . Depression Brother   . Diabetes type II Sister     1sister  . Hyperthyroidism Sister   . Depression Sister   . Obesity Sister   . Multiple myeloma Son     1 son  . Uterine cancer Daughter     2 daughters total  . Depression Daughter     SOCIAL HISTORY: Social History   Social History  . Marital status: Divorced    Spouse name: N/A  . Number of children: 3  . Years of education: N/A   Occupational History  . [employer]   Social History Main Topics  . Smoking status: Never Smoker  . Smokeless tobacco: Never Used  . Alcohol use 0.0 oz/week     Comment: occ  . Drug use: No  .  Sexual activity: Not on file   Other Topics Concern  . Not on file   Social History Narrative   Right handed.  Married, 3 kids.  Caffeine 1 cups daily.  Retired.      PHYSICAL EXAM  Vitals:   11/07/15 0919  BP: 132/77  Pulse: 72  Resp: 16  Weight: 230 lb 6.4 oz (104.5 kg)  Height: '5\' 10"'  (1.778 m)   Body mass index is 33.06 kg/m.  Generalized: Well developed, in no acute distress   Neurological examination  Mentation: Alert oriented to time, place, history taking. Follows all commands speech and language fluent Cranial nerve II-XII: Pupils were equal round reactive to light. Extraocular movements were full, visual field were full on confrontational test. Facial sensation and strength were normal. Uvula tongue midline. Head turning and shoulder shrug  were normal and symmetric. Motor: The motor testing reveals 5 over 5 strength of all 4 extremities. Good symmetric motor tone is noted throughout.  Sensory: Sensory testing is intact to soft touch on all 4 extremities. No evidence of extinction is noted.  Coordination: Cerebellar testing reveals good finger-nose-finger and heel-to-shin bilaterally.  Gait and station: Gait is normal. Tandem gait is normal. Romberg is negative. No drift is seen.  Reflexes: Deep tendon reflexes are symmetric and normal  bilaterally.   DIAGNOSTIC DATA (LABS, IMAGING, TESTING) - I reviewed patient records, labs, notes, testing and imaging myself where available.  ASSESSMENT AND PLAN 65 y.o. year old male  has a past medical history of ADD (attention deficit disorder) without hyperactivity; Anxiety; Chronic insomnia; Depression; Diverticulosis (1997); DM2 (diabetes mellitus, type 2) ([place]); Fibromyalgia; History of pancreatitis (1997); History of prostate cancer; HLD (hyperlipidemia); Hypercholesterolemia; Hypertension; Kidney stones; Neuropathy ([place]); OSA (obstructive sleep apnea); OSA on CPAP (01/15/2014); and Osteoarthritis. here with :  1. Obstructive sleep apnea on CPAP 2. Daytime sleepiness 3. Head trauma  The patient download indicates a high apnea level. I will order a AutoSet for him to use for 30 days in order for [place] to determine an adequate pressure setting. The patient is also reporting headache, dizziness and disorientation after head injury that occurred about 1 month ago. He also reports a history of low platelets. I will order a CT of the head to rule out any acute injury. Of course his symptoms could also be contributed to inadequate treatment of sleep apnea. Patient is amenable to this plan. He will follow-up in 3-4 months or sooner if needed.     Ward Givens, MSN, NP-C 11/07/2015, 9:41 AM [facility] [address], [place], [place] [address] [phone]

## 2015-11-07 NOTE — Telephone Encounter (Signed)
Rx for modafinil faxed and confirmed to [facility] at [phone].

## 2015-11-07 NOTE — Telephone Encounter (Signed)
I received a call from [place] at [place]. She read to me the CT head impression from today's CT scan. I delivered th CT head results to Ward Givens, NP who ordered the scan.

## 2015-11-07 NOTE — Progress Notes (Signed)
I agree with the assessment and plan as directed by NP .The patient is known to me .   Larra Crunkleton, MD  

## 2015-11-07 NOTE — Patient Instructions (Signed)
[facility] will send you a loaner maching- you will use for 30 days. They will send report to me and I will cal with results Use machine nightly >4 hours each night If your symptoms worsen or you develop new symptoms please let us know.

## 2015-11-07 NOTE — Telephone Encounter (Signed)
Pt called said [facility] said they need a referral. Pls call him

## 2015-11-07 NOTE — Telephone Encounter (Signed)
These results indicate that he has a 8 mm high-density lesion in the left frontal white matter. A calcified lesion or posttraumatic hemorrhage cannot be determined. I had Dr. Jannifer Franklin view the scan as well. He agrees with the findings and suggest we proceed with an MRI of the brain.

## 2015-11-07 NOTE — Addendum Note (Signed)
Addended by: Lester [place]town A on: 11/07/2015 04:46 PM   Modules accepted: Orders

## 2015-11-07 NOTE — Addendum Note (Signed)
Addended by: Lester Terry A on: 11/07/2015 04:44 PM   Modules accepted: Orders

## 2015-11-08 DIAGNOSIS — R930 Abnormal findings on diagnostic imaging of skull and head, not elsewhere classified: Secondary | ICD-10-CM | POA: Diagnosis not present

## 2015-11-11 ENCOUNTER — Telehealth: Payer: Self-pay | Admitting: Adult Health

## 2015-11-11 ENCOUNTER — Other Ambulatory Visit: Payer: Self-pay | Admitting: Neurology

## 2015-11-11 DIAGNOSIS — R930 Abnormal findings on diagnostic imaging of skull and head, not elsewhere classified: Secondary | ICD-10-CM

## 2015-11-11 NOTE — Telephone Encounter (Signed)
Thank you for informing me of the MRI results. The patient has meanwhile returned home.no change in therapy, revisit as scheduled. CD

## 2015-11-11 NOTE — Telephone Encounter (Signed)
Dr. Felecia Shelling to read after morning pts.

## 2015-11-11 NOTE — Telephone Encounter (Signed)
Pt called said he had MRI Friday and the results were to be here today. He said he is going out of town tomorrow and the results were to be a rush. Please call

## 2015-11-11 NOTE — Telephone Encounter (Signed)
Dr. Felecia Shelling has read study.

## 2015-11-11 NOTE — Telephone Encounter (Signed)
I called the patient. I spoke to his wife who is on his DPR form. 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 I reviewed this with the wife. No changes with his treatment plan at this time.

## 2015-12-18 HISTORY — PX: CERVICAL SPINE SURGERY: SHX589

## 2016-01-02 ENCOUNTER — Telehealth: Payer: Self-pay | Admitting: Adult Health

## 2016-01-02 NOTE — Telephone Encounter (Signed)
[facility] did not give pt an auto cpap. They gave him a cpap set at 13 cm H2O (which was not ordered.) We have contacted [facility] to make them aware of this mistake and they will call the pt. Pt still needs a 30 day auto cpap 5-15 cmH2O.

## 2016-01-02 NOTE — Telephone Encounter (Signed)
Patient called, states NP, Megan changed CPAP machine at last visit, states he used machine for a month, turned machine back in to [facility] November 9th for download of results for [facility] and Dr. Brett Fairy to review and we would contact him regarding adjusting settings on machine. Patient states he hasn't heard from anyone. Please call [phone] to advise.

## 2016-01-07 NOTE — Telephone Encounter (Signed)
I verified with [facility] that they are working with the pt regarding this issue. They are working to get [place] an update.

## 2016-01-07 NOTE — Telephone Encounter (Signed)
Received this notice from [facility]:  "Here is the update for this pt's situtation. Stanton Kidney also reviewed this with Robin. Please let me know if you have any questions. Thanks.   Some how someone put the cpap in [place] wirelessly and someone changed the pressure, Im guessing by accident. We never put titration units in [place] so I don't know who it was that did this or why. Our RT has taken it out of [place] so it shouldn't happen again.  The patient came in and picked up a new titration unit and we explained to the patient what happened. Patient understood. "

## 2016-01-20 ENCOUNTER — Telehealth: Payer: Self-pay

## 2016-01-20 NOTE — Telephone Encounter (Signed)
Refill request for Modafinil. Rx due, and has pending appt in January, ok to print?

## 2016-01-21 MED ORDER — MODAFINIL 200 MG PO TABS: 200.0000 mg | ORAL_TABLET | Freq: Every day | ORAL | 0 refills | Status: DC

## 2016-01-21 NOTE — Telephone Encounter (Signed)
Rx faxed to requested pharmacy 

## 2016-01-21 NOTE — Addendum Note (Signed)
Addended by: Laurence Spates on: 01/21/2016 12:21 PM   Modules accepted: Orders

## 2016-01-21 NOTE — Telephone Encounter (Signed)
Rx printed and put on desk for signature

## 2016-01-21 NOTE — Telephone Encounter (Signed)
Yes, OK to print.

## 2016-01-29 ENCOUNTER — Encounter: Payer: Self-pay | Admitting: Adult Health

## 2016-02-20 ENCOUNTER — Telehealth: Payer: Self-pay | Admitting: Neurology

## 2016-02-20 DIAGNOSIS — Z9989 Dependence on other enabling machines and devices: Principal | ICD-10-CM

## 2016-02-20 DIAGNOSIS — G4733 Obstructive sleep apnea (adult) (pediatric): Secondary | ICD-10-CM

## 2016-02-20 NOTE — Telephone Encounter (Signed)
Patient is calling requesting results for an auto titration he did.

## 2016-02-20 NOTE — Telephone Encounter (Signed)
I called pt. He returned his auto titration machine 2 weeks ago.   I have asked [facility] to send [place] the auto titration download and I will call him when we receive those results. Pt verbalized understanding.

## 2016-02-24 NOTE — Telephone Encounter (Signed)
Auto titration results received. Will place results on Megan's desk for her review.

## 2016-02-25 NOTE — Telephone Encounter (Signed)
I called pt again to discuss. No answer, left a message asking him to call me back. 

## 2016-02-25 NOTE — Telephone Encounter (Signed)
I reviewed on a titration. Also consulted with Dr. Norlene Duel patient will resume CPAP therapy- pressure setting at 12 7cm water with EPR 3.

## 2016-02-25 NOTE — Telephone Encounter (Signed)
I called pt to discuss. No answer, left a message asking him to call me back. 

## 2016-02-25 NOTE — Addendum Note (Signed)
Addended by: Trudie Buckler on: 02/25/2016 10:03 AM   Modules accepted: Orders

## 2016-02-26 NOTE — Telephone Encounter (Signed)
I called pt. No answer, left a message asking him to call me back. 

## 2016-02-27 NOTE — Telephone Encounter (Signed)
I called pt. I advised her that Bradley Blossom, NP reviewed his auto pap results and recommended that he start cpap at 12 cm H2O. Pt says that [facility] has already taken care of it. He has an appt already scheduled on 04/14/16 with Bradley Blossom, NP to see how he is doing. Pt verbalized understanding of results. Pt had no questions at this time but was encouraged to call back if questions arise.

## 2016-03-09 ENCOUNTER — Ambulatory Visit: Payer: Medicare Other | Admitting: Adult Health

## 2016-04-14 ENCOUNTER — Ambulatory Visit (INDEPENDENT_AMBULATORY_CARE_PROVIDER_SITE_OTHER): Payer: Medicare Other | Admitting: Adult Health

## 2016-04-14 ENCOUNTER — Encounter: Payer: Self-pay | Admitting: Adult Health

## 2016-04-14 VITALS — BP 107/68 | HR 71 | Ht 70.0 in | Wt 231.2 lb

## 2016-04-14 DIAGNOSIS — Z9989 Dependence on other enabling machines and devices: Secondary | ICD-10-CM | POA: Diagnosis not present

## 2016-04-14 DIAGNOSIS — G4733 Obstructive sleep apnea (adult) (pediatric): Secondary | ICD-10-CM

## 2016-04-14 DIAGNOSIS — G47419 Narcolepsy without cataplexy: Secondary | ICD-10-CM

## 2016-04-14 NOTE — Progress Notes (Signed)
PATIENT: Bradley Medina DOB: 02-14-1951  REASON FOR VISIT: follow up- obstructive sleep apnea on CPAP, narcolepsy, restless leg syndrome HISTORY FROM: patient  HISTORY OF PRESENT ILLNESS: Bradley Medina is a 66 year old male with a history of obstructive sleep apnea on CPAP, narcolepsy and restless leg syndrome. He returns today for follow-up. His compliance download indicates that he uses machine 30 out of 30 days for compliance of 100%. Each night he uses machine greater than 4 hours. His residual AHI is 2.4 on 12 cmH20with EPR 3. The patient residual apnea has significantly improved on 12 cm of water. The patient states that he does have some daytime sleepiness but feels that it is tolerable at this time. He is no longer taking Provigil. States that him and his wife have been going to the gym 3 times a week and he is found that the beneficial. He denies any new neurological symptoms. He returns today for an evaluation.  HISTORY 01/02/16: Bradley Medina is a 66 year old male with a history of obstructive sleep apnea on CPAP, narcolepsy and restless leg syndrome. He returns today for a compliance download. His download indicates that he uses machine 30 out of 30 days for compliance of 100%. He uses machine greater than 4 hours each night. On average he uses his machine 7 hours and 45 minutes. He is on a pressure of 8 cm of water with EPR 1. His residual AHI is 20.9. He does not have a significantly. He states that he has been using Provigil and it has been slightly beneficial. He denies starting any new medication. Reports that he has gained some weight since the last visit. He also reports that he's been waking up with a headache that he describes as a pressure sensation on the forehead, dizziness and disorientation. He states that this also started approximately a month ago when he had an injury to his forehead. He states that he was out "shooting a grounghog" and the scope of his bow popped him slightly above  the eyebrows in the middle of his eyes. He states that he had 2 black eyes afterwards and a laceration. He has not sought any medical treatment after this event. He reports now that the symptoms have continued for the last month- he is concerned that in the past he's been told he has low platelets- he is concerned about any internal bleeding. He returns today for an evaluation.  HISTORY: Interval history from 04/30/2015. Bradley Medina has meanwhile moved full-time back to [place], [place]. 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 This is the explanation why this patient remains excessively daytime sleepy but being compliant with CPAP. He also has symptoms of clinical depression. He endorsed the geriatric depression score at 14 out of 15 points. His obstructive sleep apnea is sufficiently treated with CPAP he is using a REM Star device by [employer], had a 96.7% percentage compliance and 93.3% compliance for daily hours of use over 4 hours. Average user time is 7 hours 50 minutes average AHI is 5.5 CPAP is set at 8 cm water pressure. The needs to be no adjustments made to his CPAP. He remains in spite of high compliance with CPAP excessively daytime sleepy as reflected in an Epworth score of 22 points, fatigue severity score 63 points. Both, narcolepsy and depression can have an effect on fatigue and Epworth sleepiness score. I do not expect that the sleep  apnea wou ld still cause sleepiness -given that is so well controlled. RLS well controlled.    REVIEW OF SYSTEMS: Out of a complete 14 system review of symptoms, the patient complains only of the following symptoms, and all other reviewed systems are negative.  Fatigue, unexpected weight change, excessive sweating, cough, excessive thirst, restless leg, apnea, daytime sleepiness, acting out dreams, neck stiffness, muscle cramps, back pain, joint pain, frequency of  urination, incontinence of bladder, environmental allergies, bruise/bleed easily, dizziness, headache, numbness, weakness, confusion, decreased concentration, depression, nervous/anxious  ALLERGIES: No Known Allergies  HOME MEDICATIONS: Outpatient Medications Prior to Visit  Medication Sig Dispense Refill  . acetaminophen (TYLENOL) 500 MG tablet Take 500 mg by mouth every 6 (six) hours as needed.    . fenofibrate micronized (LOFIBRA) 200 MG capsule Take 200 mg by mouth daily before breakfast.    . fexofenadine (ALLEGRA) 180 MG tablet Take 180 mg by mouth as needed.     Marland Kitchen losartan-hydrochlorothiazide (HYZAAR) 100-25 MG per tablet Take 1 tablet by mouth daily.    . metFORMIN (GLUCOPHAGE) 500 MG tablet Take 500 mg by mouth.    . pioglitazone (ACTOS) 15 MG tablet Take 15 mg by mouth.    Marland Kitchen rOPINIRole (REQUIP) 3 MG tablet Take 3 mg by mouth.    . venlafaxine XR (EFFEXOR-XR) 150 MG 24 hr capsule Take 150 mg by mouth.    . modafinil (PROVIGIL) 200 MG tablet Take 1 tablet (200 mg total) by mouth daily. (Patient not taking: Reported on 04/14/2016) 30 tablet 0  . rosuvastatin (CRESTOR) 10 MG tablet Take 1 tablet (10 mg total) by mouth at bedtime. (Patient taking differently: Take 5 mg by mouth at bedtime. ) 90 tablet 3  . gabapentin (NEURONTIN) 300 MG capsule Take 300 mg by mouth 2 (two) times daily.    . Omega-3 Fatty Acids (FISH OIL) 600 MG CAPS Take 600 mg by mouth 2 (two) times daily.    Marland Kitchen omeprazole (PRILOSEC) 40 MG capsule Take 40 mg by mouth daily.     No facility-administered medications prior to visit.     PAST MEDICAL HISTORY: Past Medical History:  Diagnosis Date  . ADD (attention deficit disorder) without hyperactivity   . Anxiety   . Chronic insomnia   . Depression   . Diverticulosis 1997   history of  . DM2 (diabetes mellitus, type 2) ([place])   . Fibromyalgia   . History of pancreatitis 1997  . History of prostate cancer    AGE 27  . HLD (hyperlipidemia)   .  Hypercholesterolemia   . Hypertension   . Kidney stones   . Neuropathy ([place])   . OSA (obstructive sleep apnea)   . OSA on CPAP 01/15/2014  . Osteoarthritis     PAST SURGICAL HISTORY: Past Surgical History:  Procedure Laterality Date  . CARPAL TUNNEL RELEASE  1998  . CERVICAL SPINE SURGERY  12/2015   Dr. Patrice Paradise  . [place]  . KNEE SURGERY    . LITHOTRIPSY  2010  . PROSTATECTOMY  2009  . TONSILLECTOMY AND ADENOIDECTOMY      FAMILY HISTORY: Family History  Problem Relation Age of Onset  . Lung cancer Father 9  . Coronary artery disease Father   . Heart failure Mother   . Coronary artery disease Mother   . Hypertension Mother   . Hyperlipidemia Mother   . Heart attack Mother   . Diabetes type II Mother   . Multiple myeloma Son  1 son  . Uterine cancer Daughter     2 daughters total  . Hypertension Brother     3 brothers total  . Hyperlipidemia Brother   . Diabetes type II Brother   . Depression Brother   . Diabetes type II Sister     1sister  . Hyperthyroidism Sister   . Depression Sister   . Obesity Sister   . Depression Daughter     SOCIAL HISTORY: Social History   Social History  . Marital status: Divorced    Spouse name: N/A  . Number of children: 3  . Years of education: N/A   Occupational History  . [employer]   Social History Main Topics  . Smoking status: Never Smoker  . Smokeless tobacco: Never Used  . Alcohol use 0.0 oz/week     Comment: occ  . Drug use: No  . Sexual activity: Not on file   Other Topics Concern  . Not on file   Social History Narrative   Right handed.  Married, 3 kids.  Caffeine 1 cups daily.  Retired.      PHYSICAL EXAM  Vitals:   04/14/16 0933  BP: 107/68  Pulse: 71  Weight: 231 lb 3.2 oz (104.9 kg)  Height: '5\' 10"'$  (1.778 m)   Body mass index is 33.17 kg/m.  Generalized: Well developed, in no acute distress   Neurological examination  Mentation: Alert oriented to  time, place, history taking. Follows all commands speech and language fluent Cranial nerve II-XII: Pupils were equal round reactive to light. Extraocular movements were full, visual field were full on confrontational test. Facial sensation and strength were normal. Uvula tongue midline. Head turning and shoulder shrug  were normal and symmetric.Neck circumference 16 inches Mallampati 2+ Motor: The motor testing reveals 5 over 5 strength of all 4 extremities. Good symmetric motor tone is noted throughout.  Sensory: Sensory testing is intact to soft touch on all 4 extremities. No evidence of extinction is noted.  Coordination: Cerebellar testing reveals good finger-nose-finger and heel-to-shin bilaterally.  Gait and station: Gait is normal. Tandem gait is unsteady. Romberg is negative. No drift is seen.  Reflexes: Deep tendon reflexes are symmetric and normal bilaterally.   DIAGNOSTIC DATA (LABS, IMAGING, TESTING) - I reviewed patient records, labs, notes, testing and imaging myself where available.  Lab Results  Component Value Date   WBC 6.1 04/07/2011   HGB 14.8 04/07/2011   HCT 42.0 04/07/2011   MCV 93.9 04/07/2011   PLT 50.0 (L) 04/07/2011      ASSESSMENT AND PLAN 66 y.o. year old male  has a past medical history of ADD (attention deficit disorder) without hyperactivity; Anxiety; Chronic insomnia; Depression; Diverticulosis (1997); DM2 (diabetes mellitus, type 2) ([place]); Fibromyalgia; History of pancreatitis (1997); History of prostate cancer; HLD (hyperlipidemia); Hypercholesterolemia; Hypertension; Kidney stones; Neuropathy ([place]); OSA (obstructive sleep apnea); OSA on CPAP (01/15/2014); and Osteoarthritis. here with:  1. Obstructive sleep apnea on CPAP 2. Narcolepsy  The patient's download shows excellent compliance and good treatment of his apnea. His residual apnea index has significantly decreased since his pressure was changed to 12 cm of water. The patient feels that his daytime  sleepiness is tolerable at this time. He does not want to restart Provigil. Advised that if his symptoms worsen or he develops new symptoms he should let us know. He will follow-up in 6 months with Dr. Brett Fairy.    Ward Givens, MSN, NP-C 04/14/2016, 10:10 AM [facility] [address], [address] [place], [place] [address] [phone]   

## 2016-04-14 NOTE — Progress Notes (Signed)
I agree with the assessment and plan as directed by NP .The patient is known to me .   Avin Upperman, MD  

## 2016-04-14 NOTE — Patient Instructions (Signed)
Continue using CPAP nightly If daytime sleepiness does not improve we can restart provigil If your symptoms worsen or you develop new symptoms please let us know.

## 2016-06-08 ENCOUNTER — Telehealth: Payer: Self-pay | Admitting: Neurology

## 2016-06-08 NOTE — Telephone Encounter (Signed)
Any questions?

## 2016-06-08 NOTE — Telephone Encounter (Signed)
Pt said he is having neck pain radiating up to head, feels like pressure on the brain, having HA's, and blurred vision for the past 4-5 mths. He said things that are going on around him don't seem to be real. He is seeing spinal surgeon on Thursday and is going to address this again. He has been told it is an expected thing post surgery.  An appt was scheduled on 5/15. Advised RN would call if there were any questions.

## 2016-06-09 NOTE — Telephone Encounter (Signed)
Noted. Sounds as if this is expected after surgery. He can call if symptoms worse and if felt not to be related to surgery.

## 2016-06-30 ENCOUNTER — Encounter: Payer: Self-pay | Admitting: Neurology

## 2016-06-30 ENCOUNTER — Ambulatory Visit (INDEPENDENT_AMBULATORY_CARE_PROVIDER_SITE_OTHER): Payer: Medicare Other | Admitting: Neurology

## 2016-06-30 VITALS — BP 146/78 | HR 85 | Ht 70.0 in | Wt 232.0 lb

## 2016-06-30 DIAGNOSIS — G8929 Other chronic pain: Secondary | ICD-10-CM | POA: Diagnosis not present

## 2016-06-30 DIAGNOSIS — M4802 Spinal stenosis, cervical region: Secondary | ICD-10-CM | POA: Diagnosis not present

## 2016-06-30 DIAGNOSIS — Z9989 Dependence on other enabling machines and devices: Secondary | ICD-10-CM | POA: Diagnosis not present

## 2016-06-30 DIAGNOSIS — M4322 Fusion of spine, cervical region: Secondary | ICD-10-CM | POA: Diagnosis not present

## 2016-06-30 DIAGNOSIS — G4733 Obstructive sleep apnea (adult) (pediatric): Secondary | ICD-10-CM

## 2016-06-30 DIAGNOSIS — M25552 Pain in left hip: Secondary | ICD-10-CM | POA: Diagnosis not present

## 2016-06-30 NOTE — Addendum Note (Signed)
Addended by: Larey Seat on: 06/30/2016 03:36 PM   Modules accepted: Orders

## 2016-06-30 NOTE — Addendum Note (Signed)
Addended by: Larey Seat on: 06/30/2016 03:40 PM   Modules accepted: Orders

## 2016-06-30 NOTE — Progress Notes (Signed)
PATIENT: Bradley Medina DOB: 1950-11-11  REASON FOR VISIT: follow up- obstructive sleep apnea on CPAP, narcolepsy, restless leg syndrome HISTORY FROM: patient  HISTORY OF PRESENT ILLNESS:  Interval history from 06/30/2016, Mr. [name] is seen here today for compliance visit but he actually has a lot of new symptoms that he would like to address including tightness in the neck, a feeling of movement in his head, a feeling of tightness as if the bandages around the back of his brain, headaches, sharp stabbing pains, shaking in his arms and blurred vision and numb feeling in the head and arms, word searching, he is swaying when standing.  The patient has 100% compliance with an average user time of 7 hours and 42 minutes of CPAP each night, the CPAP is set at 12 cm water 3 to centimeter EPR and a residual AHI of 2.2. He does not have excessive air leaks. He endorsed the erratic depression score at 12 out of 15 points, the Epworth sleepiness score at 16 and the fatigue severity was not endorsed. For patient with very well controlled apnea he is still excessively daytime sleepy. The additional symptoms that he described may be not related to his sleep disorder . He has undergone a cervical fusion spine surgery in October 2017 under the guidance of Dr. Rennis Harding. The patient reports that after the surgery he received trigger point injections through Dr. Towanda Malkin office, but these have helped very very little. His scar is well-healed but there is palpable tenderness in the paraspinal region up to the occipital notch and the retroauricular region.    Mr. [name] is a 66 year old male with a history of obstructive sleep apnea on CPAP, narcolepsy and restless leg syndrome. He returns today for follow-up. His compliance download indicates that he uses machine 30 out of 30 days for compliance of 100%. Each night he uses machine greater than 4 hours. His residual AHI is 2.4 on 12 cmH20with EPR 3. The patient residual  apnea has significantly improved on 12 cm of water. The patient states that he does have some daytime sleepiness but feels that it is tolerable at this time. He is no longer taking Provigil. States that him and his wife have been going to the gym 3 times a week and he is found that the beneficial. He denies any new neurological symptoms. He returns today for an evaluation.    REVIEW OF SYSTEMS: Out of a complete 14 system review of symptoms, the patient complains only of the following symptoms, and all other reviewed systems are    Pain, pain . Pain - paraspinela, occipital and retroauricular, left TMJ is tender, too. Clicking.   Fatigue, unexpected weight change, excessive sweating, cough, excessive thirst, restless leg, apnea, daytime sleepiness, acting out dreams, neck stiffness, muscle cramps, back pain, joint pain, frequency of urination, incontinence of bladder, environmental allergies, bruise/bleed easily, dizziness, headache, numbness, weakness, confusion, decreased concentration, depression, nervous/anxious  ALLERGIES: Allergies  Allergen Reactions  . Aspirin     Chronically Low platelets    HOME MEDICATIONS: Outpatient Medications Prior to Visit  Medication Sig Dispense Refill  . acetaminophen (TYLENOL) 500 MG tablet Take 500 mg by mouth every 6 (six) hours as needed.    . fenofibrate micronized (LOFIBRA) 200 MG capsule Take 200 mg by mouth daily before breakfast.    . fexofenadine (ALLEGRA) 180 MG tablet Take 180 mg by mouth as needed.     Marland Kitchen losartan-hydrochlorothiazide (HYZAAR) 100-25 MG per tablet Take 1  tablet by mouth daily.    . metFORMIN (GLUCOPHAGE) 500 MG tablet Take 500 mg by mouth.    . pioglitazone (ACTOS) 15 MG tablet Take 15 mg by mouth.    Marland Kitchen rOPINIRole (REQUIP) 3 MG tablet Take 3 mg by mouth.    . venlafaxine XR (EFFEXOR-XR) 150 MG 24 hr capsule Take 150 mg by mouth.    . rosuvastatin (CRESTOR) 10 MG tablet Take 1 tablet (10 mg total) by mouth at bedtime.  (Patient taking differently: Take 5 mg by mouth at bedtime. ) 90 tablet 3  . cyclobenzaprine (FLEXERIL) 10 MG tablet Take 10 mg by mouth 2 (two) times daily.    . modafinil (PROVIGIL) 200 MG tablet Take 1 tablet (200 mg total) by mouth daily. (Patient not taking: Reported on 04/14/2016) 30 tablet 0   No facility-administered medications prior to visit.     PAST MEDICAL HISTORY: Past Medical History:  Diagnosis Date  . ADD (attention deficit disorder) without hyperactivity   . Anxiety   . Chronic insomnia   . Depression   . Diverticulosis 1997   history of  . DM2 (diabetes mellitus, type 2) ([place])   . Fibromyalgia   . History of pancreatitis 1997  . History of prostate cancer    AGE 89  . HLD (hyperlipidemia)   . Hypercholesterolemia   . Hypertension   . Kidney stones   . Neuropathy   . OSA (obstructive sleep apnea)   . OSA on CPAP 01/15/2014  . Osteoarthritis     PAST SURGICAL HISTORY: Past Surgical History:  Procedure Laterality Date  . CARPAL TUNNEL RELEASE  1998  . CERVICAL SPINE SURGERY  12/2015   Dr. Patrice Paradise  . [place]  . KNEE SURGERY    . LITHOTRIPSY  2010  . PROSTATECTOMY  2009  . TONSILLECTOMY AND ADENOIDECTOMY      FAMILY HISTORY: Family History  Problem Relation Age of Onset  . Lung cancer Father 59  . Coronary artery disease Father   . Heart failure Mother   . Coronary artery disease Mother   . Hypertension Mother   . Hyperlipidemia Mother   . Heart attack Mother   . Diabetes type II Mother   . Multiple myeloma Son        1 son  . Uterine cancer Daughter        2 daughters total  . Hypertension Brother        3 brothers total  . Hyperlipidemia Brother   . Diabetes type II Brother   . Depression Brother   . Diabetes type II Sister        1sister  . Hyperthyroidism Sister   . Depression Sister   . Obesity Sister   . Depression Daughter     SOCIAL HISTORY: Social History   Social History  . Marital status: Divorced     Spouse name: N/A  . Number of children: 3  . Years of education: N/A   Occupational History  . [employer]   Social History Main Topics  . Smoking status: Never Smoker  . Smokeless tobacco: Never Used  . Alcohol use 0.0 oz/week     Comment: occ  . Drug use: No  . Sexual activity: Not on file   Other Topics Concern  . Not on file   Social History Narrative   Right handed.  Married, 3 kids.  Caffeine 1 cups daily.  Retired.  PHYSICAL EXAM  Vitals:   06/30/16 1450  BP: (!) 146/78  Pulse: 85  Weight: 232 lb (105.2 kg)  Height: '5\' 10"'  (1.778 m)   Body mass index is 33.29 kg/m.  Generalized: Well developed, in no acute distress   Neurological examination  Mentation: Alert oriented to time, place, history taking. Follows all commands speech and language fluent Cranial nerve ; complete loss of  taste and  smell.   Pupils were equal round reactive to light.  Facial sensation; numbness both  Cheeks.  Facial strength is normal. Uvula and  tongue midline.  Head turning and shoulder shrug  were normal and symmetric. Neck circumference 16 inches Mallampati 2+ Motor:  Symmetric motor tone is noted throughout.  Sensory: soft touch and vibration on all 4 extremities.  Coordination:   finger-nose-finger and heel-to-shin bilaterally.  Gait and station: Gait is normal. Tandem gait is unsteady.  Romberg is negative. Reflexes: Deep tendon reflexes are symmetric and normal bilaterally.   DIAGNOSTIC DATA (LABS, IMAGING, TESTING) - I reviewed patient records, labs, notes, testing and imaging myself where available.  Lab Results  Component Value Date   WBC 6.1 04/07/2011   HGB 14.8 04/07/2011   HCT 42.0 04/07/2011   MCV 93.9 04/07/2011   PLT 50.0 (L) 04/07/2011      ASSESSMENT AND PLAN 66 y.o. year old male  has a past medical history of ADD (attention deficit disorder) without hyperactivity; Anxiety; Chronic insomnia; Depression; Diverticulosis (1997); DM2  (diabetes mellitus, type 2) ([place]); Fibromyalgia; History of pancreatitis (1997); History of prostate cancer; HLD (hyperlipidemia); Hypercholesterolemia; Hypertension; Kidney stones; Neuropathy; OSA (obstructive sleep apnea); OSA on CPAP (01/15/2014); and Osteoarthritis. here with:  1. Obstructive sleep apnea on CPAP, 100% compliance , low residual AHI.  2  Hypersomnia , remaining epwrth score of 15 - 17 points on CPAP 3. Postoperative cervical pain syndrome, needs to return to his surgeon and referred to a pain management center.  4. Plans to have lumbal spine surgery in June 2018 Dr. Patrice Paradise.    The patient's download shows excellent compliance and good treatment of his apnea. His residual apnea index has significantly decreased since his pressure was changed to 12 cm of water. The patient feels that his daytime sleepiness is tolerable at this time. He does not want to restart Provigil.   He needs a pain clinic visit  - his post op pain syndrome is a cervicalgia which radiates to hands and occipital head.  Advised that if his symptoms worsen or he develops new symptoms he should let us know. He will follow-up in 6 months with Dr. Brett Fairy.   Larey Seat, MD   06/30/2016, 3:10 PM [facility] [address], [place], [place] [address] [phone]

## 2016-07-28 ENCOUNTER — Ambulatory Visit (INDEPENDENT_AMBULATORY_CARE_PROVIDER_SITE_OTHER): Payer: Medicare Other | Admitting: Sports Medicine

## 2016-07-28 ENCOUNTER — Other Ambulatory Visit: Payer: Medicare Other

## 2016-07-28 ENCOUNTER — Encounter: Payer: Self-pay | Admitting: Sports Medicine

## 2016-07-28 ENCOUNTER — Ambulatory Visit
Admission: RE | Admit: 2016-07-28 | Discharge: 2016-07-28 | Disposition: A | Discharge status: Home / Self Care | Payer: Medicare Other | Source: Ambulatory Visit | Attending: Sports Medicine | Admitting: Sports Medicine

## 2016-07-28 ENCOUNTER — Other Ambulatory Visit: Payer: Self-pay | Admitting: Sports Medicine

## 2016-07-28 VITALS — BP 146/76 | Ht 70.0 in | Wt 239.0 lb

## 2016-07-28 DIAGNOSIS — M25552 Pain in left hip: Secondary | ICD-10-CM

## 2016-07-28 DIAGNOSIS — M25551 Pain in right hip: Secondary | ICD-10-CM

## 2016-07-28 DIAGNOSIS — M48062 Spinal stenosis, lumbar region with neurogenic claudication: Secondary | ICD-10-CM

## 2016-07-28 NOTE — Patient Instructions (Signed)
Thank you for coming in. Please read about spinal stenosis as we think this is the cause of the buttock and leg pain that you are having. We will also get an xray of your hip and will call you with the results.    Spinal Stenosis Spinal stenosis occurs when the open space (spinal canal) between the bones of your spine (vertebrae) narrows, putting pressure on the spinal cord or nerves. What are the causes? This condition is caused by areas of bone pushing into the central canals of your vertebrae. This condition may be present at birth (congenital), or it may be caused by:  Arthritic deterioration of your vertebrae (spinal degeneration). This usually starts around age 69.  Injury or trauma to the spine.  Tumors in the spine.  Calcium deposits in the spine.  What are the signs or symptoms? Symptoms of this condition include:  Pain in the neck or back that is generally worse with activities, particularly when standing and walking.  Numbness, tingling, hot or cold sensations, weakness, or weariness in your legs.  Pain going up and down the leg (sciatica).  Frequent episodes of falling.  A foot-slapping gait that leads to muscle weakness.  In more serious cases, you may develop:  Problemspassing stool or passing urine.  Difficulty having sex.  Loss of feeling in part or all of your leg.  Symptoms may come on slowly and get worse over time. How is this diagnosed? This condition is diagnosed based on your medical history and a physical exam. Tests will also be done, such as:  MRI.  CT scan.  X-ray.  How is this treated? Treatment for this condition often focuses on managing your pain and any other symptoms. Treatment may include:  Practicing good posture to lessen pressure on your nerves.  Exercising to strengthen muscles, build endurance, improve balance, and maintain good joint movement (range of motion).  Losing weight, if needed.  Taking medicines to reduce  swelling, inflammation, or pain.  Assistive devices, such as a corset or brace.  In some cases, surgery may be needed. The most common procedure is decompression laminectomy. This is done to remove excess bone that puts pressure on your nerve roots. Follow these instructions at home: Managing pain, stiffness, and swelling  Do all exercises and stretches as told by your health care provider.  Practice good posture. If you were given a brace or a corset, wear it as told by your health care provider.  Do not do any activities that cause pain. Ask your health care provider what activities are safe for you.  Do not lift anything that is heavier than 10 lb (4.5 kg) or the limit that your health care provider tells you.  Maintain a healthy weight. Talk with your health care provider if you need help losing weight.  If directed, apply heat to the affected area as often as told by your health care provider. Use the heat source that your health care provider recommends, such as a moist heat pack or a heating pad. ? Place a towel between your skin and the heat source. ? Leave the heat on for 20-30 minutes. ? Remove the heat if your skin turns bright red. This is especially important if you are not able to feel pain, heat, or cold. You may have a greater risk of getting burned. General instructions  Take over-the-counter and prescription medicines only as told by your health care provider.  Do not use any products that contain nicotine or  tobacco, such as cigarettes and e-cigarettes. If you need help quitting, ask your health care provider.  Eat a healthy diet. This includes plenty of fruits and vegetables, whole grains, and low-fat (lean) protein.  Keep all follow-up visits as told by your health care provider. This is important. Contact a health care provider if:  Your symptoms do not get better or they get worse.  You have a fever. Get help right away if:  You have new or worse pain in  your neck or upper back.  You have severe pain that cannot be controlled with medicines.  You are dizzy.  You have vision problems, blurred vision, or double vision.  You have a severe headache that is worse when you stand.  You have nausea or you vomit.  You develop new or worse numbness or tingling in your back or legs.  You have pain, redness, swelling, or warmth in your arm or leg. Summary  Spinal stenosis occurs when the open space (spinal canal) between the bones of your spine (vertebrae) narrows. This narrowing puts pressure on the spinal cord or nerves.  Spinal stenosis can cause numbness, weakness, or pain in the neck, back, and legs.  This condition may be caused by a birth defect, arthritic deterioration of your vertebrae, injury, tumors, or calcium deposits.  This condition is usually diagnosed with MRIs, CT scans, and X-rays. This information is not intended to replace advice given to you by your health care provider. Make sure you discuss any questions you have with your health care provider. Document Released: 04/25/2003 Document Revised: 01/08/2016 Document Reviewed: 01/08/2016 Elsevier Interactive Patient Education  2017 [employer].

## 2016-07-28 NOTE — Progress Notes (Signed)
Subjective:    Patient ID: Bradley Medina, male    DOB: 07/20/1950, 66 y.o.   MRN: [id number]  HPI Bradley Medina is a 66 yo male with PMH of lumbar spinal stenosis and cervical spinal stenosis s/p c-spine surgery who presents with bilateral hip pain. The pain is located in the buttock region and has been present for the past few years (but he has been having back pain for longer). Pain is a stretching sensation that is intermittent and radiates down to his lower extremities bilaterally. Pain on the left side is worse compared to the right. There is no pain with sitting, but there is pain with prolonged standing and walking. Pain improves with forward flexion of his back. No numbness or paresthesias in his lower extremities. No urinary retention but does have incontinence due to prostrate procedure in the past. Reports the pain wakes him up at night at times. No unintentional weight loss. States he feels that he has issues with his coordination of his lower extremities recently. He has had a few falls with the most recent one this past Thursday while he was on a train. He is not exactly sure what happened and reports his "legs were not doing what I wanted them to do". As a result of the fall his right lower extremity is swollen and bruised. He takes Gabapentin and PRN vicodin for his pain. He has a history of lumbar spinal stenosis, and has back surgery scheduled with Orthopedics. He is not sure if he would like to proceed and would like to see if his buttock and leg pain are due to hip pathology or due to his back.    Review of Systems As noted above.    Objective:   Physical Exam Gen: NAD, pleasant  Hip: ROM Right hip Internal Rotation about 10-20 degrees, Left hip internal rotation about 30 degrees. Normal right and left hip external rotation. Normal flexion, extension, abduction and adduction bilaterally.  Strength IR: 5/5, ER: 5/5, Flexion: 5/5, Extension: 5/5, Abduction: 5/5, Adduction:  5/5 Pelvic alignment unremarkable to inspection and palpation. Slightly antalgic gait. No trendelenburg Right foot is externally rotated with ambulation Greater trochanter without tenderness to palpation. No tenderness over piriformis and greater trochanter. No SI joint tenderness  Normal 2+ patellar reflexes bilaterally  2+ Dorsalis pedis and posterior tibial pulses bilaterally      Assessment & Plan:  Bilateral Buttock Pain with radiation to the lower extremities:  It is unlikely that the patient's symptoms are caused by hip pathology as pain is in the buttock region with radiation to the lower extremities, and he does not have any groin pain which would be more indicative of hip pathology. His symptoms are more consistent with lumbar spinal stenosis which this patient has as noted on MRI lumbar spine in 08/2015. We will obtain x-rays of the hips, but even if this shows some signs of OA, his symptoms are still likely due to spinal stenosis.   Bradley Houseman, MD PGY 2 Family Medicine   Patient seen and evaluated with the resident. I agree with the above plan of care. Patient has a pretty classic history of lumbar spinal stenosis. An MRI scan of his lumbar spine done in July 2017 showed severe spinal stenosis at the L4-L5 level. He will likely need surgical decompression for this. His clinical history certainly does not fit that of osteoarthritis of the hip but we will check an x-ray just to be sure. Phone follow-up with the  patient and his wife after I reviewed those images. In the meantime, I provided them with patient information about spinal stenosis and encouraged him to call me with questions or concerns in the interim.

## 2016-07-29 ENCOUNTER — Ambulatory Visit (INDEPENDENT_AMBULATORY_CARE_PROVIDER_SITE_OTHER): Payer: Medicare Other | Admitting: Neurology

## 2016-07-29 ENCOUNTER — Telehealth: Payer: Self-pay | Admitting: *Deleted

## 2016-07-29 ENCOUNTER — Encounter (INDEPENDENT_AMBULATORY_CARE_PROVIDER_SITE_OTHER): Payer: Self-pay | Admitting: Neurology

## 2016-07-29 DIAGNOSIS — M5412 Radiculopathy, cervical region: Secondary | ICD-10-CM | POA: Insufficient documentation

## 2016-07-29 DIAGNOSIS — Z9989 Dependence on other enabling machines and devices: Secondary | ICD-10-CM

## 2016-07-29 DIAGNOSIS — E0842 Diabetes mellitus due to underlying condition with diabetic polyneuropathy: Secondary | ICD-10-CM

## 2016-07-29 DIAGNOSIS — M4322 Fusion of spine, cervical region: Secondary | ICD-10-CM

## 2016-07-29 DIAGNOSIS — Z0289 Encounter for other administrative examinations: Secondary | ICD-10-CM

## 2016-07-29 DIAGNOSIS — G4733 Obstructive sleep apnea (adult) (pediatric): Secondary | ICD-10-CM

## 2016-07-29 DIAGNOSIS — R200 Anesthesia of skin: Secondary | ICD-10-CM

## 2016-07-29 DIAGNOSIS — M4802 Spinal stenosis, cervical region: Secondary | ICD-10-CM

## 2016-07-29 MED ORDER — MODAFINIL 200 MG PO TABS: 100.0000 mg | ORAL_TABLET | Freq: Every day | ORAL | 0 refills | Status: DC

## 2016-07-29 NOTE — Telephone Encounter (Addendum)
I went to the lobby to discuss this with the pt. Pt reports that he was taking 1/2 tablet of modafinil 200mg  daily but stopped it (modafinil was costly to the pt, even with insurance). He did not want to restart it at his last office visit on 06/30/16 with Dr. Brett Fairy. Pt is falling asleep more often and now is interested in restarting it. I advised him that I will send this request to the [facility], Dr. Felecia Shelling for review. Pt verbalized understanding.

## 2016-07-29 NOTE — Progress Notes (Signed)
Full Name: Bradley Medina Gender: Male MRN #: [id number] Date of Birth: Jun 18, 2050    Visit Date: 07/29/16 14:21 Age: 66 Years 56 Months Old Examining Physician: Arlice Colt, MD, PhD Referring Physician: Dohmeier, MD    History: Bradley Medina is a 66 year old man who has had a C4-C6 ACDF reporting more pain in both arms. On examination, there is normal strength in both arms. He also reports numbness in the toes. He has normal strength but reduced vibration sensation in the toes. He has edema and bruising in the left lower leg associated with a laceration.  Nerve conduction studies: Bilateral median motor responses had mildly prolonged distal latencies, left worse than right, with normal amplitudes and forearm conduction. Bilateral median sensory responses had delayed peak latencies, left worse than right, with reduced amplitudes.   Bilateral ulnar motor responses were normal. Bilateral ulnar sensory responses were normal.    Ulnar F wave responses were normal.  The left peroneal motor response had normal amplitude and slightly reduced conduction velocity. The left tibial motor response had reduced amplitude and slightly reduced conduction velocity.   Left cervical superficial peroneal sensory responses were absent.   The tibial F wave response was slightly delayed.  Electromyography: Needle EMG of muscles of the left arm was performed. Mild chronic denervation was noted in the left triceps, EDC, pronator teres, bracioradialis and deltoid. The biceps, first dorsal interosseous muscle and abductor pollicis brevis muscles were normal.  Impression: 1. 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 2. 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 Mild length-dependent sensorimotor polyneuropathy.   Yitta Gongaware A. Felecia Shelling, MD, PhD, FAAN Certified in Neurology, Clinical Neurophysiology, Sleep Medicine, Pain Medicine and  Neuroimaging Director, [facility] [address] [place], [place] [address] [phone]  ADDENDUM:  Bradley Medina was advised to see his primary care doctor or urgent care about the laceration in the left leg as he may need to get a tetanus shot and could have a superimposed cellulitis -- Bradley Medina       MNC    Nerve / Sites Muscle Latency Ref. Amplitude Ref. Rel Amp Segments Distance Velocity Ref. Area    ms ms mV mV %  cm m/s m/s mVms  L Median - APB     Wrist APB 5.0 ?4.4 6.0 ?4.0 100 Wrist - APB 7   21.2     Upper arm APB 9.7  5.6  92.4 Upper arm - Wrist 24 51 ?49 20.7  R Median - APB     Wrist APB 4.5 ?4.4 6.2 ?4.0 100 Wrist - APB 7   19.4     Upper arm APB 9.4  5.0  79.6 Upper arm - Wrist 24 49 ?49 15.2  L Ulnar - ADM     Wrist ADM 2.8 ?3.3 10.5 ?6.0 100 Wrist - ADM 7   28.3     B.Elbow ADM 7.0  8.7  82.8 B.Elbow - Wrist 21 50 ?49 23.2     A.Elbow ADM 9.4  8.4  96.5 A.Elbow - B.Elbow 12 49 ?49 21.7         A.Elbow - Wrist      R Ulnar - ADM     Wrist ADM 3.1 ?3.3 10.5 ?6.0 100 Wrist - ADM 7   24.9     B.Elbow ADM 7.3  9.4  89.4 B.Elbow -  Wrist 21 50 ?49 23.3     A.Elbow ADM 9.7  8.6  91.3 A.Elbow - B.Elbow 12 50 ?49 22.1         A.Elbow - Wrist      L Peroneal - EDB     Ankle EDB 4.5 ?6.5 2.9 ?2.0 100 Ankle - EDB 9   10.0     Fib head EDB 12.6  2.8  93.9 Fib head - Ankle 33 41 ?44 10.1     Pop fossa EDB 15.6  2.5  89.4 Pop fossa - Fib head 12 39 ?44 9.1         Pop fossa - Ankle      L Tibial - AH     Ankle AH 5.2 ?5.8 0.8 ?4.0 100 Ankle - AH 9   1.5     Pop fossa AH 14.8  0.7  83.6 Pop fossa - Ankle 36 37 ?41 2.2                 SNC    Nerve / Sites Rec. Site Peak Lat Ref.  Amp Ref. Segments Distance    ms ms V V  cm  L Sural - Ankle (Calf)     Calf Ankle NR ?4.4 NR ?6 Calf - Ankle 14  L Superficial peroneal - Ankle     Lat leg Ankle NR ?4.4 NR ?6 Lat leg - Ankle 14  L Median -  Orthodromic (Dig II, Mid palm)     Dig II Wrist 3.9 ?3.4 5 ?10 Dig II - Wrist 13  R Median - Orthodromic (Dig II, Mid palm)     Dig II Wrist 3.5 ?3.4 6 ?10 Dig II - Wrist 13  L Ulnar - Orthodromic, (Dig V, Mid palm)     Dig V Wrist 2.9 ?3.1 6 ?5 Dig V - Wrist 11  R Ulnar - Orthodromic, (Dig V, Mid palm)     Dig V Wrist 2.9 ?3.1 6 ?5 Dig V - Wrist 63                 F  Wave    Nerve F Lat Ref.   ms ms  L Ulnar - ADM 32.0 ?32.0  R Ulnar - ADM 31.6 ?32.0  L Tibial - AH 58.9 ?56.0           EMG full       EMG Summary Table    Spontaneous MUAP Recruitment  Muscle IA Fib PSW Fasc Other Amp Dur. Poly Pattern  L. Deltoid Normal None None None _______ Normal Normal 1+ Reduced  L. Triceps brachii Increased 1+ None None _______ Increased Increased 2+ Reduced  L. Biceps brachii Normal None None None _______ Normal Normal Normal Normal  L. Pronator teres Normal None None None _______ Normal Increased 1+ Normal  L. Extensor digitorum communis Normal None None None _______ Increased Increased 1+ Reduced  L. First dorsal interosseous Normal None None None _______ Normal Normal Normal Normal  L. Abductor pollicis brevis Normal None None None _______ Normal Normal Normal Normal  L. Brachioradialis Normal None None None _______ Increased Normal 1+ Reduced

## 2016-07-29 NOTE — Telephone Encounter (Signed)
Dr. Felecia Shelling signed paper RX and I gave RX to pt's wife, per DPR. Pt's wife want to explore different pharmacies to find the cheapest.

## 2016-07-29 NOTE — Telephone Encounter (Signed)
Ok to restart

## 2016-07-31 ENCOUNTER — Telehealth: Payer: Self-pay | Admitting: Sports Medicine

## 2016-07-31 NOTE — Telephone Encounter (Signed)
I left a message on the patient's voicemail today concerning x-rays of his right hip. There is only mild degenerative changes within the hip joint. I've explained to him that his hip pain is not originating from this. It is most likely from his spinal stenosis. He is scheduled to meet with Dr.Cohen soon to discuss lumbar spine decompression. I will remain available to see him as needed.

## 2016-09-08 ENCOUNTER — Telehealth: Payer: Self-pay | Admitting: Neurology

## 2016-09-08 NOTE — Telephone Encounter (Signed)
Patient called office in reference to having 1 fall yesterday and tremors in arms, and all the way down his body going on for about 5 weeks.

## 2016-09-08 NOTE — Telephone Encounter (Signed)
Called pt in reference to his concern. Pt states that he had a fall yesterday and hit his head on the refrigerator. I advised the pt that we typically see him for sleep apnea and cpap use and that this is a new finding. I asked the pt to describe how often tremors happen and pt stated that the tremors come and go. Pt does states he is having headaches since hitting head. I expressed for pt to reach out to primary care doctor and if they aren't able to see him it may be worth being seeing by urgent care or ED in event that by hitting his head something else could be going on. Pt verbalized understanding and said he would reach out to PCP.

## 2016-10-13 ENCOUNTER — Ambulatory Visit: Payer: Medicare Other | Admitting: Neurology

## 2016-10-14 ENCOUNTER — Encounter: Payer: Self-pay | Admitting: Neurology

## 2016-10-14 ENCOUNTER — Ambulatory Visit (INDEPENDENT_AMBULATORY_CARE_PROVIDER_SITE_OTHER): Payer: Medicare Other | Admitting: Neurology

## 2016-10-14 VITALS — BP 91/58 | HR 71 | Ht 70.0 in | Wt 230.0 lb

## 2016-10-14 DIAGNOSIS — M5412 Radiculopathy, cervical region: Secondary | ICD-10-CM

## 2016-10-14 DIAGNOSIS — G4719 Other hypersomnia: Secondary | ICD-10-CM | POA: Diagnosis not present

## 2016-10-14 DIAGNOSIS — G8928 Other chronic postprocedural pain: Secondary | ICD-10-CM

## 2016-10-14 DIAGNOSIS — E1149 Type 2 diabetes mellitus with other diabetic neurological complication: Secondary | ICD-10-CM | POA: Diagnosis not present

## 2016-10-14 DIAGNOSIS — E1140 Type 2 diabetes mellitus with diabetic neuropathy, unspecified: Secondary | ICD-10-CM

## 2016-10-14 MED ORDER — MODAFINIL 200 MG PO TABS: 100.0000 mg | ORAL_TABLET | Freq: Every day | ORAL | 3 refills | Status: DC

## 2016-10-14 NOTE — Patient Instructions (Signed)
Orthostatic Hypotension Orthostatic hypotension is a sudden drop in blood pressure that happens when you quickly change positions, such as when you get up from a seated or lying position. Blood pressure is a measurement of how strongly, or weakly, your blood is pressing against the walls of your arteries. Arteries are blood vessels that carry blood from your heart throughout your body. When blood pressure is too low, you may not get enough blood to your brain or to the rest of your organs. This can cause weakness, light-headedness, rapid heartbeat, and fainting. This can last for just a few seconds or for up to a few minutes. Orthostatic hypotension is usually not a serious problem. However, if it happens frequently or gets worse, it may be a sign of something more serious. What are the causes? This condition may be caused by:  Sudden changes in posture, such as standing up quickly after you have been sitting or lying down.  Blood loss.  Loss of body fluids (dehydration).  Heart problems.  Hormone (endocrine) problems.  Pregnancy.  Severe infection.  Lack of certain nutrients.  Severe allergic reactions (anaphylaxis).  Certain medicines, such as blood pressure medicine or medicines that make the body lose excess fluids (diuretics). Sometimes, this condition can be caused by not taking medicine as directed, such as taking too much of a certain medicine.  What increases the risk? Certain factors can make you more likely to develop orthostatic hypotension, including:  Age. Risk increases as you get older.  Conditions that affect the heart or the central nervous system.  Taking certain medicines, such as blood pressure medicine or diuretics.  Being pregnant.  What are the signs or symptoms? Symptoms of this condition may include:  Weakness.  Light-headedness.  Dizziness.  Blurred vision.  Fatigue.  Rapid heartbeat.  Fainting, in severe cases.  How is this  diagnosed? This condition is diagnosed based on:  Your medical history.  Your symptoms.  Your blood pressure measurement. Your health care provider will check your blood pressure when you are: ? Lying down. ? Sitting. ? Standing.  A blood pressure reading is recorded as two numbers, such as "120 over 80" (or 120/80). The first ("top") number is called the systolic pressure. It is a measure of the pressure in your arteries as your heart beats. The second ("bottom") number is called the diastolic pressure. It is a measure of the pressure in your arteries when your heart relaxes between beats. Blood pressure is measured in a unit called mm Hg. Healthy blood pressure for adults is 120/80. If your blood pressure is below 90/60, you may be diagnosed with hypotension. Other information or tests that may be used to diagnose orthostatic hypotension include:  Your other vital signs, such as your heart rate and temperature.  Blood tests.  Tilt table test. For this test, you will be safely secured to a table that moves you from a lying position to an upright position. Your heart rhythm and blood pressure will be monitored during the test.  How is this treated? Treatment for this condition may include:  Changing your diet. This may involve eating more salt (sodium) or drinking more water.  Taking medicines to raise your blood pressure.  Changing the dosage of certain medicines you are taking that might be lowering your blood pressure.  Wearing compression stockings. These stockings help to prevent blood clots and reduce swelling in your legs.  In some cases, you may need to go to the hospital for:    Fluid replacement. This means you will receive fluids through an IV tube.  Blood replacement. This means you will receive donated blood through an IV tube (transfusion).  Treating an infection or heart problems, if this applies.  Monitoring. You may need to be monitored while medicines that you  are taking wear off.  Follow these instructions at home: Eating and drinking   Drink enough fluid to keep your urine clear or pale yellow.  Eat a healthy diet and follow instructions from your health care provider about eating or drinking restrictions. A healthy diet includes: ? Fresh fruits and vegetables. ? Whole grains. ? Lean meats. ? Low-fat dairy products.  Eat extra salt only as directed. Do not add extra salt to your diet unless your health care provider told you to do that.  Eat frequent, small meals.  Avoid standing up suddenly after eating. Medicines  Take over-the-counter and prescription medicines only as told by your health care provider. ? Follow instructions from your health care provider about changing the dosage of your current medicines, if this applies. ? Do not stop or adjust any of your medicines on your own. General instructions  Wear compression stockings as told by your health care provider.  Get up slowly from lying down or sitting positions. This gives your blood pressure a chance to adjust.  Avoid hot showers and excessive heat as directed by your health care provider.  Return to your normal activities as told by your health care provider. Ask your health care provider what activities are safe for you.  Do not use any products that contain nicotine or tobacco, such as cigarettes and e-cigarettes. If you need help quitting, ask your health care provider.  Keep all follow-up visits as told by your health care provider. This is important. Contact a health care provider if:  You vomit.  You have diarrhea.  You have a fever for more than 2-3 days.  You feel more thirsty than usual.  You feel weak and tired. Get help right away if:  You have chest pain.  You have a fast or irregular heartbeat.  You develop numbness in any part of your body.  You cannot move your arms or your legs.  You have trouble speaking.  You become sweaty or feel  lightheaded.  You faint.  You feel short of breath.  You have trouble staying awake.  You feel confused. This information is not intended to replace advice given to you by your health care provider. Make sure you discuss any questions you have with your health care provider. Document Released: 01/23/2002 Document Revised: 10/22/2015 Document Reviewed: 07/26/2015 Elsevier Interactive Patient Education  2018 [employer]  

## 2016-10-14 NOTE — Progress Notes (Signed)
PATIENT: Bradley Medina DOB: March 06, 1950  REASON FOR VISIT: follow up- obstructive sleep apnea on CPAP, narcolepsy, restless leg syndrome HISTORY FROM: patient  HISTORY OF PRESENT ILLNESS:  Interval history from 06/30/2016, Bradley Medina is seen here today for compliance visit but he actually has a lot of new symptoms that he would like to address including tightness in the neck, a feeling of movement in his head, a feeling of tightness as if the bandages around the back of his brain, headaches, sharp stabbing pains, shaking in his arms and blurred vision and numb feeling in the head and arms, word searching, he is swaying when standing.  The patient has 100% compliance with an average user time of 7 hours and 42 minutes of CPAP each night, the CPAP is set at 12 cm water 3 to centimeter EPR and a residual AHI of 2.2. He does not have excessive air leaks. He endorsed the erratic depression score at 12 out of 15 points, the Epworth sleepiness score at 16 and the fatigue severity was not endorsed. For patient with very well controlled apnea he is still excessively daytime sleepy. The additional symptoms that he described may be not related to his sleep disorder . He has undergone a cervical fusion spine surgery in October 2017 under the guidance of Dr. Rennis Harding. The patient reports that after the surgery he received trigger point injections through Dr. Towanda Malkin office, but these have helped very very little. His scar is well-healed but there is palpable tenderness in the paraspinal region up to the occipital notch and the retroauricular region.    Bradley Medina is a 66 year old male with a history of obstructive sleep apnea on CPAP, narcolepsy and restless leg syndrome. He returns today for follow-up. His compliance download indicates that he uses machine 30 out of 30 days for compliance of 100%. Each night he uses machine greater than 4 hours. His residual AHI is 2.4 on 12 cmH20with EPR 3. The patient residual  apnea has significantly improved on 12 cm of water. The patient states that he does have some daytime sleepiness but feels that it is tolerable at this time. He is no longer taking Provigil. States that him and his wife have been going to the gym 3 times a week and he is found that the beneficial. He denies any new neurological symptoms. He returns today for an evaluation.    REVIEW OF SYSTEMS: Out of a complete 14 system review of symptoms, the patient complains only of the following symptoms, and all other reviewed systems are    Pain, pain . Pain - paraspinela, occipital and retroauricular, left TMJ is tender, too. Clicking.   Fatigue, unexpected weight change, excessive sweating, cough, excessive thirst, restless leg, apnea, daytime sleepiness, acting out dreams, neck stiffness, muscle cramps, back pain, joint pain, frequency of urination, incontinence of bladder, environmental allergies, bruise/bleed easily, dizziness, headache, numbness, weakness, confusion, decreased concentration, depression, nervous/anxious  ALLERGIES: Allergies  Allergen Reactions  . Aspirin     Chronically Low platelets    HOME MEDICATIONS: Outpatient Medications Prior to Visit  Medication Sig Dispense Refill  . fenofibrate micronized (LOFIBRA) 200 MG capsule TAKE 1 CAP BY MOUTH IN THE MORNING BEFORE BREAKFAST    . fexofenadine (ALLEGRA) 180 MG tablet Take 180 mg by mouth as needed.     . gabapentin (NEURONTIN) 300 MG capsule Take 300 mg by mouth 2 (two) times daily.     Marland Kitchen losartan-hydrochlorothiazide (HYZAAR) 100-25 MG per tablet Take  1 tablet by mouth daily.    . metFORMIN (GLUCOPHAGE) 500 MG tablet Take 500 mg by mouth 2 (two) times daily with a meal.     . modafinil (PROVIGIL) 200 MG tablet Take 0.5 tablets (100 mg total) by mouth daily. 30 tablet 0  . pioglitazone (ACTOS) 45 MG tablet Take 45 mg by mouth daily.     Marland Kitchen rOPINIRole (REQUIP) 3 MG tablet Take 3 mg by mouth.    . venlafaxine XR (EFFEXOR-XR) 150  MG 24 hr capsule Take 150 mg by mouth.    . rosuvastatin (CRESTOR) 10 MG tablet Take 1 tablet (10 mg total) by mouth at bedtime. (Patient taking differently: Take 5 mg by mouth at bedtime. ) 90 tablet 3  . acetaminophen (TYLENOL) 500 MG tablet Take 500 mg by mouth every 6 (six) hours as needed.    . baclofen (LIORESAL) 10 MG tablet Take 10 mg by mouth 3 (three) times daily.    . fenofibrate micronized (LOFIBRA) 200 MG capsule Take 200 mg by mouth daily before breakfast.    . rOPINIRole (REQUIP) 3 MG tablet Take 3 mg by mouth.     No facility-administered medications prior to visit.     PAST MEDICAL HISTORY: Past Medical History:  Diagnosis Date  . ADD (attention deficit disorder) without hyperactivity   . Anxiety   . Chronic insomnia   . Depression   . Diverticulosis 1997   history of  . DM2 (diabetes mellitus, type 2) ([place])   . Fibromyalgia   . History of pancreatitis 1997  . History of prostate cancer    AGE 66  . HLD (hyperlipidemia)   . Hypercholesterolemia   . Hypertension   . Kidney stones   . Neuropathy   . OSA (obstructive sleep apnea)   . OSA on CPAP 01/15/2014  . Osteoarthritis     PAST SURGICAL HISTORY: Past Surgical History:  Procedure Laterality Date  . CARPAL TUNNEL RELEASE  1998  . CERVICAL SPINE SURGERY  12/2015   Dr. Patrice Paradise  . [place]  . KNEE SURGERY    . LITHOTRIPSY  2010  . PROSTATECTOMY  2009  . TONSILLECTOMY AND ADENOIDECTOMY      FAMILY HISTORY: Family History  Problem Relation Age of Onset  . Lung cancer Father 3  . Coronary artery disease Father   . Heart failure Mother   . Coronary artery disease Mother   . Hypertension Mother   . Hyperlipidemia Mother   . Heart attack Mother   . Diabetes type II Mother   . Multiple myeloma Son        1 son  . Uterine cancer Daughter        2 daughters total  . Hypertension Brother        3 brothers total  . Hyperlipidemia Brother   . Diabetes type II Brother   . Depression  Brother   . Diabetes type II Sister        1sister  . Hyperthyroidism Sister   . Depression Sister   . Obesity Sister   . Depression Daughter     SOCIAL HISTORY: Social History   Social History  . Marital status: Divorced    Spouse name: N/A  . Number of children: 3  . Years of education: N/A   Occupational History  . [employer]   Social History Main Topics  . Smoking status: Never Smoker  . Smokeless tobacco: Never Used  .  Alcohol use 0.0 oz/week     Comment: occ  . Drug use: No  . Sexual activity: Not on file   Other Topics Concern  . Not on file   Social History Narrative   Right handed.  Married, 3 kids.  Caffeine 1 cups daily.  Retired.      PHYSICAL EXAM  Vitals:   10/14/16 1355  BP: 116/70  Pulse: 73  Weight: 230 lb (104.3 kg)  Height: _0  (1.778 m)   Body mass index is 33 kg/m.  Generalized: Well developed, in no acute distress   Neurological examination  Mentation: Alert oriented to time, place, history taking. Follows all commands speech and language fluent Cranial nerve ; complete loss of  taste and  smell.   Pupils were equal round reactive to light.  Facial sensation; numbness both  Cheeks.  Facial strength is normal. Uvula and  tongue midline.  Head turning ROM reduced.  Neck circumference 16 inches Mallampati 2+ Motor:  Left shoulder lower.Sensory: soft touch and vibration on all 4 extremities.  Coordination:   Intact finger to nose.  Gait and station: Bradley Medina gait is not shuffling, he keeps his feet pointed outwards, does keep wider gait with, has normal arm swing and no visible hand tremor as he walks. No titubation/ jaw tremor is noted, his voice is steady. He has a hoarse voice but not tremulous. Reflexes: Deep tendon reflexes are symmetric .   DIAGNOSTIC DATA (LABS, IMAGING, TESTING)  reviewed EMG and NCV and discussed results with patient.   Lab Results  Component Value Date   WBC 6.1 04/07/2011   HGB  14.8 04/07/2011   HCT 42.0 04/07/2011   MCV 93.9 04/07/2011   PLT 50.0 (L) 04/07/2011      ASSESSMENT AND PLAN 66 y.o. year old male here with:   1. Frequent headaches, tremors in the upper body, lightheadedness was also stasis, stiffness when walking, pain in shoulders and hips, hoarse voice and a more masked face, all beginning after cervical spine anterior fusion. I suspect a diabetic autonomic neuropathy, aside from the documented sensorineural neuropathy- but contributing can be a remaining spinal stenosis.   2.a the patient reports leg jerking at night, acting out his dreams, feeling like he is truly living them, difficulties to his differentiate reality from dream. In addition trouble with memory, anxiety depression and excess sweating. This can be REM BD- and would be treated with melatonin, if this fails, clonazepam at 0.5 mg at night. Could be induced by Requip?   2b. Obstructive sleep apnea on CPAP, 100% compliance , low residual AHI.  Hypersomnia , remaining epworth score of 15 - 17 points on CPAP  3.  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  He remains with a Postoperative cervical pain syndrome, needs to return to his surgeon and referred to a pain management center.    PLAN: Planned to have lumbal spine surgery in June 2018 - Dr. Patrice Paradise has seen him last in April. Surgery did not take place, he is now to see Dr. Randa Spike [facility]. EDS :  He does not want to restart Provigil.  RLS : He wants to stay on Requip.  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 Refilled modafinil. Get orthostatics with RN.    Dr Claiborne Billings- I am a sleep specialist . He needs a pain clinic visit  - his post op pain  syndrome is a cervicalgia which radiates to hands and occipital head. [place] colleague , Dr Felecia Shelling, has documented bilateral carpal tunnel, C6-7 radiculopathy- chronic denervation.    Larey Seat, MD   10/14/2016, 2:17 PM [facility] [address], [place], [place] [address] [phone]

## 2016-12-01 ENCOUNTER — Other Ambulatory Visit: Payer: Self-pay | Admitting: Urology

## 2016-12-01 DIAGNOSIS — N2889 Other specified disorders of kidney and ureter: Secondary | ICD-10-CM

## 2016-12-02 ENCOUNTER — Ambulatory Visit
Admission: RE | Admit: 2016-12-02 | Discharge: 2016-12-02 | Disposition: A | Discharge status: Home / Self Care | Payer: Medicare Other | Source: Ambulatory Visit | Attending: Urology | Admitting: Urology

## 2016-12-02 DIAGNOSIS — N2889 Other specified disorders of kidney and ureter: Secondary | ICD-10-CM

## 2016-12-02 HISTORY — PX: IR RADIOLOGIST EVAL & MGMT: IMG5224

## 2016-12-02 NOTE — Consult Note (Signed)
Chief Complaint: Left sided renal lesion  Referring Physician(s): Eskew,Lawrence A ([facility]) Jacqulyn Ducking (Oncology)  History of Present Illness:  Bradley Medina is a 66 y.o. male with past medical history significant for myelodysplasia and chronic thrombocytopenia (managed by Dr. Harlow Asa), type 2 diabetes, prostate cancer, hyperlipidemia, hypertension, obstructive sleep apnea (patient utilizes CPAP device at night) depression and anxiety who was found to have a indeterminate left-sided renal lesion on abdominal MRI performed on 11/07/2016 for the workup of back pain. Subsequent abdominal MRI performed 11/30/2016 confirmed the presence of an approximately 2.1 x 2.0 cm heterogeneously enhancing partially exophytic lesion arising from the posterior interpolar aspect of the left kidney worrisome for an RCC.  Patient was evaluated by Dr. Estill Dooms ([facility]), however given patient's history of MDS and chronic thrombocytopenia, was referred to the interventional radiology clinic to evaluate for potential percutaneous treatment options. The patient is accompanied by his wife though serves as his own historian.  The patient is currently without complaint in regards to the incidentally discovered left-sided renal lesion. Specifically, no flank pain or hematuria. No unintentional weight loss. No fever or chills. No chest pain or shortness of breath.    Past Medical History:  Diagnosis Date  . ADD (attention deficit disorder) without hyperactivity   . Anxiety   . Chronic insomnia   . Depression   . Diverticulosis 1997   history of  . DM2 (diabetes mellitus, type 2) ([place])   . Fibromyalgia   . History of pancreatitis 1997  . History of prostate cancer    AGE 60  . HLD (hyperlipidemia)   . Hypercholesterolemia   . Hypertension   . Kidney stones   . Neuropathy   . OSA (obstructive sleep apnea)   . OSA on CPAP 01/15/2014  . Osteoarthritis     Past Surgical History:  Procedure Laterality  Date  . CARPAL TUNNEL RELEASE  1998  . CERVICAL SPINE SURGERY  12/2015   Dr. Patrice Paradise  . [place]  . KNEE SURGERY    . LITHOTRIPSY  2010  . PROSTATECTOMY  2009  . TONSILLECTOMY AND ADENOIDECTOMY      Allergies: Aspirin  Medications: Prior to Admission medications   Medication Sig Start Date End Date Taking? Authorizing Provider  fenofibrate micronized (LOFIBRA) 200 MG capsule TAKE 1 CAP BY MOUTH IN THE MORNING BEFORE BREAKFAST 07/06/16  Yes [provider]  fexofenadine (ALLEGRA) 180 MG tablet Take 180 mg by mouth as needed.    Yes [provider]  gabapentin (NEURONTIN) 300 MG capsule Take 300 mg by mouth 2 (two) times daily.  08/26/15  Yes [provider]  losartan-hydrochlorothiazide (HYZAAR) 100-25 MG per tablet Take 1 tablet by mouth daily.   Yes [provider]  metFORMIN (GLUCOPHAGE) 500 MG tablet Take 500 mg by mouth 2 (two) times daily with a meal.    Yes [provider]  modafinil (PROVIGIL) 200 MG tablet Take 0.5 tablets (100 mg total) by mouth daily. 10/14/16  Yes Dohmeier, Asencion Partridge, MD  pioglitazone (ACTOS) 45 MG tablet Take 45 mg by mouth daily.    Yes [provider]  rOPINIRole (REQUIP) 3 MG tablet Take 3 mg by mouth.   Yes [provider]  venlafaxine XR (EFFEXOR-XR) 150 MG 24 hr capsule Take 150 mg by mouth. 01/01/14  Yes [provider]  rosuvastatin (CRESTOR) 10 MG tablet Take 1 tablet (10 mg total) by mouth at bedtime. Patient taking differently: Take 5 mg by  mouth at bedtime.  04/07/11 01/15/14  Larey Dresser, MD     Family History  Problem Relation Age of Onset  . Lung cancer Father 19  . Coronary artery disease Father   . Heart failure Mother   . Coronary artery disease Mother   . Hypertension Mother   . Hyperlipidemia Mother   . Heart attack Mother   . Diabetes type II Mother   . Multiple myeloma Son        1 son  . Uterine cancer Daughter        2 daughters total  .  Hypertension Brother        3 brothers total  . Hyperlipidemia Brother   . Diabetes type II Brother   . Depression Brother   . Diabetes type II Sister        1sister  . Hyperthyroidism Sister   . Depression Sister   . Obesity Sister   . Depression Daughter     Social History   Social History  . Marital status: Divorced    Spouse name: N/A  . Number of children: 3  . Years of education: N/A   Occupational History  . [employer]   Social History Main Topics  . Smoking status: Never Smoker  . Smokeless tobacco: Never Used  . Alcohol use 0.0 oz/week     Comment: occ  . Drug use: No  . Sexual activity: Not on file   Other Topics Concern  . Not on file   Social History Narrative   Right handed.  Married, 3 kids.  Caffeine 1 cups daily.  Retired.    ECOG Status: 0 - Asymptomatic  Review of Systems: A 12 point ROS discussed and pertinent positives are indicated in the HPI above.  All other systems are negative.  Review of Systems  Constitutional: Negative for activity change, appetite change, fever and unexpected weight change.  Respiratory: Negative.   Cardiovascular: Negative.   Gastrointestinal: Negative.   Genitourinary: Negative for flank pain and hematuria.    Vital Signs: BP 121/64   Pulse 68   Temp 98.4 F (36.9 C) (Oral)   Resp 14   Ht '5\' 10"'  (1.778 m)   Wt 232 lb (105.2 kg)   SpO2 98%   BMI 33.29 kg/m   Physical Exam  Constitutional: He appears well-developed and well-nourished.  HENT:  Head: Normocephalic and atraumatic.  Cardiovascular: Normal rate and regular rhythm.   Pulmonary/Chest: Effort normal and breath sounds normal.  Genitourinary:  Genitourinary Comments: No costovertebral angle tenderness.  Skin: Skin is warm and dry.  Psychiatric: He has a normal mood and affect. His behavior is normal.  Nursing note and vitals reviewed.   Imaging:  Lumbar spine MRI - 11/10/2016; renal ultrasound - 11/26/2016; Abdominal MRI  - 11/30/2016  Labs:  CBC: No results for input(s): WBC, HGB, HCT, PLT in the last 8760 hours.  COAGS: No results for input(s): INR, APTT in the last 8760 hours.  BMP: No results for input(s): NA, K, CL, CO2, GLUCOSE, BUN, CALCIUM, CREATININE, GFRNONAA, GFRAA in the last 8760 hours.  Invalid input(s): CMP  LIVER FUNCTION TESTS: No results for input(s): BILITOT, AST, ALT, ALKPHOS, PROT, ALBUMIN in the last 8760 hours.  TUMOR MARKERS: No results for input(s): AFPTM, CEA, CA199, CHROMGRNA in the last 8760 hours.  Assessment and Plan:  Bradley Medina is a 66 y.o. male with past medical history significant for myelodysplasia and chronic thrombocytopenia (managed  by Dr. Harlow Asa), type 2 diabetes, prostate cancer, hyperlipidemia, hypertension, obstructive sleep apnea (patient utilizes CPAP device at night), depression and anxiety who was found to have a indeterminate left-sided renal lesion on abdominal MRI performed on 11/07/2016 for the workup of back pain. Subsequent abdominal MRI performed 11/30/2016 confirmed the presence of an approximately 2.1 x 2.0 cm heterogeneously enhancing partially exophytic lesion arising from the posterior interpolar aspect of the left kidney worrisome for an RCC.   The patient is currently without complaint in regards to the incidentally discovered left-sided renal lesion.   Personal review of abdominal MRI performed 11/30/2016 confirmed the presence of an approximately 2.1 cm heterogeneously enhancing partially exophytic lesion arising from the posterior interpolar aspect of the left kidney.  As the patient has been been a poor operative candidate given his history of thrombocytopenia, potential treatment options were discussed at length with the patient and the patient's wife including the following:  #1 - Conservative management with observation and acquisition of a follow-up MRI in 3 months. #2 - Proceeding with CT and ultrasound-guided renal cryoablation and  potential biopsy.  Risks and benefits of renal lesion cryoablation were discussed with the patient including, but not limited to failure to treat entire lesion, bleeding, infection, damage to adjacent structures, decrease in renal function or post procedural neuropathy.  I explained that as we are unable to achieve a surgical/pathologic margin, we will require surveillance following the procedure for approximately 3 years to ensure complete lesion erradication.  All of the patient's questions were answered.  Following this prolonged and detailed conversation, the patient wishes to proceed with CT and ultrasound-guided left renal cryoablation and potential biopsy.  Pending insurance approval, this procedure will be scheduled at the next available anesthesia slot at [facility].  The procedure will entail an overnight admission for continued observation and PCA usage.    Given patient's history of MDS and thrombocytopenia, he will require a platelet transfusion prior to the procedure.  Thank you for this interesting consult.  I greatly enjoyed meeting AVARI GELLES and look forward to participating in their care.  A copy of this report was sent to the requesting provider on this date.  Electronically Signed: Sandi Mariscal 12/02/2016, 12:11 PM   I spent a total of 40 Minutes in face to face in clinical consultation, greater than 50% of which was counseling/coordinating care for Left sided renal lesion.

## 2016-12-16 ENCOUNTER — Encounter: Payer: Self-pay | Admitting: Interventional Radiology

## 2016-12-23 ENCOUNTER — Other Ambulatory Visit (HOSPITAL_COMMUNITY): Payer: Self-pay | Admitting: Interventional Radiology

## 2016-12-23 ENCOUNTER — Other Ambulatory Visit: Payer: Self-pay | Admitting: *Deleted

## 2016-12-23 DIAGNOSIS — N2889 Other specified disorders of kidney and ureter: Secondary | ICD-10-CM

## 2017-01-03 ENCOUNTER — Encounter: Payer: Self-pay | Admitting: Neurology

## 2017-01-05 ENCOUNTER — Ambulatory Visit (INDEPENDENT_AMBULATORY_CARE_PROVIDER_SITE_OTHER): Payer: Medicare Other | Admitting: Neurology

## 2017-01-05 ENCOUNTER — Encounter: Payer: Self-pay | Admitting: Neurology

## 2017-01-05 VITALS — BP 128/68 | HR 72 | Ht 70.0 in | Wt 226.0 lb

## 2017-01-05 DIAGNOSIS — Z9989 Dependence on other enabling machines and devices: Secondary | ICD-10-CM | POA: Diagnosis not present

## 2017-01-05 DIAGNOSIS — G2581 Restless legs syndrome: Secondary | ICD-10-CM

## 2017-01-05 DIAGNOSIS — G4733 Obstructive sleep apnea (adult) (pediatric): Secondary | ICD-10-CM | POA: Diagnosis not present

## 2017-01-05 MED ORDER — ROPINIROLE HCL 3 MG PO TABS: 3.0000 mg | ORAL_TABLET | Freq: Every day | ORAL | 3 refills | Status: DC

## 2017-01-05 MED ORDER — MODAFINIL 200 MG PO TABS: 100.0000 mg | ORAL_TABLET | Freq: Every day | ORAL | 3 refills | Status: DC

## 2017-01-05 NOTE — Progress Notes (Signed)
PATIENT: Bradley Medina DOB: May 26, 1950  REASON FOR VISIT: follow up- obstructive sleep apnea on CPAP, narcolepsy, restless leg syndrome HISTORY FROM: patient and wife-   HISTORY OF PRESENT ILLNESS:  01-05-2017, Bradley Medina has been diagnosed with a malignant hypernephroma and is treated by Dr. Estill Dooms. He underwent a freeze treatment. He has been resting well, he had no symptoms from the tumor, incidentally found on a spine MRI ( Dr. Harl Bowie in [place].).  Endorsed today of fatigue severity score of 60 points, and an Epworth sleepiness score of 17 points, both highly elevated he has used his CPAP is very compliantly 800% of the last 30 days and 97% by time, there was only one day where he just barely fell short of 4 hours of use. He is using CPAP at 12 cm water pressure with 3 cm EPR and a residual AHI was measured at 0.9/h.  Average use of time is 7 hours 34 minutes.  The result is excellent. I have no explanation for the high rated EDS- Requip elated ? Paraneoplastic?  Has neck pain, spine degeneration. He needs to follow up with pain specialist or in the [facility] ortho clinic- Dr. Harl Bowie.    Interval history from 06/30/2016, Bradley Medina is seen here today for compliance visit but he actually has a lot of new symptoms that he would like to address including tightness in the neck, a feeling of movement in his head, a feeling of tightness as if the bandages around the back of his brain, headaches, sharp stabbing pains, shaking in his arms and blurred vision and numb feeling in the head and arms, word searching, he is swaying when standing.  The patient has 100% compliance with an average user time of 7 hours and 42 minutes of CPAP each night, the CPAP is set at 12 cm water 3 to centimeter EPR and a residual AHI of 2.2. He does not have excessive air leaks. He endorsed the erratic depression score at 12 out of 15 points, the Epworth sleepiness score at 16 and the fatigue severity was not endorsed. For patient with  very well controlled apnea he is still excessively daytime sleepy. The additional symptoms that he described may be not related to his sleep disorder . He has undergone a cervical fusion spine surgery in October 2017 under the guidance of Dr. Rennis Harding. The patient reports that after the surgery he received trigger point injections through Dr. Towanda Malkin office, but these have helped very very little. His scar is well-healed but there is palpable tenderness in the paraspinal region up to the occipital notch and the retroauricular region.    Bradley. Medina is a 66 year old male with a history of obstructive sleep apnea on CPAP, narcolepsy and restless leg syndrome. He returns today for follow-up. His compliance download indicates that he uses machine 30 out of 30 days for compliance of 100%. Each night he uses machine greater than 4 hours. His residual AHI is 2.4 on 12 cmH20with EPR 3. The patient residual apnea has significantly improved on 12 cm of water. The patient states that he does have some daytime sleepiness but feels that it is tolerable at this time. He is no longer taking Provigil. States that him and his wife have been going to the gym 3 times a week and he is found that the beneficial. He denies any new neurological symptoms. He returns today for an evaluation.    REVIEW OF SYSTEMS: Out of a complete 14 system review  of symptoms, the patient complains only of the following symptoms, and all other reviewed systems are   Excessive daytime sleepiness.  Pain, pain . Pain - paraspinela, occipital and retroauricular, left TMJ is tender, too. Clicking. He was found to have a renal tumor - treated by Dr Estill Dooms   Fatigue, unexpected weight change, excessive sweating, cough, excessive thirst, restless leg, apnea, daytime sleepiness, acting out dreams, neck stiffness, muscle cramps, back pain, joint pain, frequency of urination, incontinence of bladder, environmental allergies, bruise/bleed easily, dizziness,  headache, numbness, weakness, confusion, decreased concentration, depression, nervous/anxious  ALLERGIES: Allergies  Allergen Reactions  . Aspirin     Chronically Low platelets    HOME MEDICATIONS: Outpatient Medications Prior to Visit  Medication Sig Dispense Refill  . fenofibrate micronized (LOFIBRA) 200 MG capsule TAKE 1 CAP BY MOUTH IN THE MORNING BEFORE BREAKFAST    . fexofenadine (ALLEGRA) 180 MG tablet Take 180 mg by mouth as needed.     Marland Kitchen losartan-hydrochlorothiazide (HYZAAR) 100-25 MG per tablet Take 1 tablet by mouth daily.    . metFORMIN (GLUCOPHAGE) 500 MG tablet Take 500 mg by mouth 2 (two) times daily with a meal.     . modafinil (PROVIGIL) 200 MG tablet Take 0.5 tablets (100 mg total) by mouth daily. 90 tablet 3  . pioglitazone (ACTOS) 45 MG tablet Take 45 mg by mouth daily.     Marland Kitchen rOPINIRole (REQUIP) 3 MG tablet Take 3 mg by mouth.    . venlafaxine XR (EFFEXOR-XR) 150 MG 24 hr capsule Take 150 mg by mouth.    . gabapentin (NEURONTIN) 300 MG capsule Take 300 mg by mouth 2 (two) times daily.     . rosuvastatin (CRESTOR) 10 MG tablet Take 1 tablet (10 mg total) by mouth at bedtime. (Patient taking differently: Take 5 mg by mouth at bedtime. ) 90 tablet 3   No facility-administered medications prior to visit.     PAST MEDICAL HISTORY: Past Medical History:  Diagnosis Date  . ADD (attention deficit disorder) without hyperactivity   . Anxiety   . Chronic insomnia   . Depression   . Diverticulosis 1997   history of  . DM2 (diabetes mellitus, type 2) ([place])   . Fibromyalgia   . History of pancreatitis 1997  . History of prostate cancer    AGE 66  . HLD (hyperlipidemia)   . Hypercholesterolemia   . Hypertension   . Kidney stones   . Neuropathy   . OSA (obstructive sleep apnea)   . OSA on CPAP 01/15/2014  . Osteoarthritis     PAST SURGICAL HISTORY: Past Surgical History:  Procedure Laterality Date  . CARPAL TUNNEL RELEASE  1998  . CERVICAL SPINE SURGERY   12/2015   Dr. Patrice Paradise  . [place]  . IR RADIOLOGIST EVAL & MGMT  12/02/2016  . KNEE SURGERY    . LITHOTRIPSY  2010  . PROSTATECTOMY  2009  . TONSILLECTOMY AND ADENOIDECTOMY      FAMILY HISTORY: Family History  Problem Relation Age of Onset  . Lung cancer Father 44  . Coronary artery disease Father   . Heart failure Mother   . Coronary artery disease Mother   . Hypertension Mother   . Hyperlipidemia Mother   . Heart attack Mother   . Diabetes type II Mother   . Multiple myeloma Son        1 son  . Uterine cancer Daughter        2 daughters total  .  Hypertension Brother        3 brothers total  . Hyperlipidemia Brother   . Diabetes type II Brother   . Depression Brother   . Diabetes type II Sister        1sister  . Hyperthyroidism Sister   . Depression Sister   . Obesity Sister   . Depression Daughter     SOCIAL HISTORY: Social History   Socioeconomic History  . Marital status: Divorced    Spouse name: Not on file  . Number of children: 3  . Years of education: Not on file  . Highest education level: Not on file  Social Needs  . Financial resource strain: Not on file  . Food insecurity - worry: Not on file  . Food insecurity - inability: Not on file  . Transportation needs - medical: Not on file  . Transportation needs - non-medical: Not on file  Occupational History  . Occupation: RETAIL    Comment: [employer]  Tobacco Use  . Smoking status: Never Smoker  . Smokeless tobacco: Never Used  Substance and Sexual Activity  . Alcohol use: Yes    Alcohol/week: 0.0 oz    Comment: occ  . Drug use: No  . Sexual activity: Not on file  Other Topics Concern  . Not on file  Social History Narrative   Right handed.  Married, 3 kids.  Caffeine 1 cups daily.  Retired.      PHYSICAL EXAM  Vitals:   01/05/17 0912  BP: 128/68  Pulse: 72  Weight: 226 lb (102.5 kg)  Height: _0  (1.778 m)   Body mass index is 32.43 kg/m.  Generalized:  Well developed, in no acute distress   Neurological examination  Mentation: Alert oriented to time, place, history taking. Follows all commands speech and language fluent Cranial nerve ; complete loss of  taste and  smell.   Pupils were equal round reactive to light.  Facial sensation; numbness both  Cheeks.  Facial strength is normal. Uvula and  tongue midline.  Head turning ROM reduced.  Neck circumference 16 inches Mallampati 2+ Motor:  Left shoulder lower.Sensory: soft touch and vibration on all 4 extremities.  Coordination:   Intact finger to nose.  Gait and station: Bradley Medina gait is not shuffling, he keeps his feet pointed outwards, does keep wider gait with, has normal arm swing and no visible hand tremor as he walks. No titubation/ jaw tremor is noted, his voice is steady. He has a hoarse voice but not tremulous. Reflexes: Deep tendon reflexes are symmetric .   DIAGNOSTIC DATA (LABS, IMAGING, TESTING)  reviewed EMG and NCV and discussed results with patient. 1. Frequent headaches, tremors in the upper body, lightheadedness was also stasis, stiffness when walking, pain in shoulders and hips, hoarse voice and a more masked face, all beginning after cervical spine anterior fusion. I suspect a diabetic autonomic neuropathy, aside from the documented sensorineural neuropathy- but contributing can be a remaining spinal stenosis.    1. Obstructive sleep apnea on CPAP, 97% compliance , low residual AHI at 0.9 .    2. Hypersomnia , remaining epworth score of 15 - 17 points on CPAP  3.  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  He remains with a Postoperative cervical pain syndrome, needs to return to his surgeon and referred to a pain management center.   4. RLS.  5.  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     PLAN: Planned to have lumbal spine surgery in June 2018 - Dr. Rennis Harding has  seen him last in April. Surgery did not take place, he is now to see Dr. Harl Bowie, [facility]. EDS :  He does not want to restart Provigil.   RLS : He wants to stay on Requip.   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  Refilled modafinil.  Get orthostatics with PCP.    Dear Dr. Claiborne Billings, I am a sleep specialist . He needs a pain clinic visit.    Larey Seat, MD   01/05/2017, 9:31 AM [facility] [address] [place], [place] [address] [phone]

## 2017-01-26 ENCOUNTER — Other Ambulatory Visit: Payer: Medicare Other

## 2017-01-26 ENCOUNTER — Other Ambulatory Visit (HOSPITAL_COMMUNITY): Payer: Self-pay | Admitting: Interventional Radiology

## 2017-01-26 ENCOUNTER — Other Ambulatory Visit: Payer: Self-pay | Admitting: *Deleted

## 2017-01-26 DIAGNOSIS — C642 Malignant neoplasm of left kidney, except renal pelvis: Secondary | ICD-10-CM

## 2017-01-27 ENCOUNTER — Other Ambulatory Visit: Payer: Self-pay | Admitting: *Deleted

## 2017-01-27 DIAGNOSIS — N2889 Other specified disorders of kidney and ureter: Secondary | ICD-10-CM

## 2017-03-18 ENCOUNTER — Encounter: Payer: Self-pay | Admitting: Radiology

## 2017-03-18 ENCOUNTER — Ambulatory Visit
Admission: RE | Admit: 2017-03-18 | Discharge: 2017-03-18 | Disposition: A | Discharge status: Home / Self Care | Payer: Medicare Other | Source: Ambulatory Visit | Attending: Interventional Radiology | Admitting: Interventional Radiology

## 2017-03-18 DIAGNOSIS — N2889 Other specified disorders of kidney and ureter: Secondary | ICD-10-CM

## 2017-03-18 HISTORY — PX: IR RADIOLOGIST EVAL & MGMT: IMG5224

## 2017-03-18 NOTE — Progress Notes (Signed)
Patient ID: CLARANCE BOLLARD, male   DOB: [birth year], 67 y.o.   MRN: [id number]   Referring Physician(s): Dr. Estill Dooms  Chief Complaint: The patient is seen in follow up today s/p left renal mass biopsy and cryoablation on 12-17-16  History of present illness:  Mr. Rubinstein is a 67 yo male with a history of myelodysplasia, DM, prostate cancer, who was found to have a 2.1cm left renal lesion.  He is followed by Dr. Estill Dooms, urology in [facility], who referred him to IR for evaluation for bx and treatment of this suspected malignant lesion.  He underwent a bx and cryoablation on 12-17-16.  he has done well since this procedure.  He was hospitalized for a bout of diverticulitis between then and now.  He is also scheduled for back surgery on 03-29-17.  He denies any urinary symptoms such as dysuria, hematuria, etc.  He presents today for his 3 month follow up.    Past Medical History:  Diagnosis Date  . ADD (attention deficit disorder) without hyperactivity   . Anxiety   . Chronic insomnia   . Depression   . Diverticulosis 1997   history of  . DM2 (diabetes mellitus, type 2) ([place])   . Fibromyalgia   . History of pancreatitis 1997  . History of prostate cancer    AGE 62  . HLD (hyperlipidemia)   . Hypercholesterolemia   . Hypertension   . Kidney stones   . Neuropathy   . OSA (obstructive sleep apnea)   . OSA on CPAP 01/15/2014  . Osteoarthritis     Past Surgical History:  Procedure Laterality Date  . CARPAL TUNNEL RELEASE  1998  . CERVICAL SPINE SURGERY  12/2015   Dr. Patrice Paradise  . [place]  . IR RADIOLOGIST EVAL & MGMT  12/02/2016  . KNEE SURGERY    . LITHOTRIPSY  2010  . PROSTATECTOMY  2009  . TONSILLECTOMY AND ADENOIDECTOMY      Allergies: Aspirin  Medications: Prior to Admission medications   Medication Sig Start Date End Date Taking? Authorizing Provider  fenofibrate micronized (LOFIBRA) 200 MG capsule TAKE 1 CAP BY MOUTH IN THE MORNING BEFORE BREAKFAST 07/06/16  Yes [provider]  fexofenadine (ALLEGRA) 180 MG tablet Take 180 mg by mouth as needed.    Yes [provider]  losartan-hydrochlorothiazide (HYZAAR) 100-25 MG per tablet Take 1 tablet by mouth daily.   Yes [provider]  metFORMIN (GLUCOPHAGE) 500 MG tablet Take 500 mg by mouth 2 (two) times daily with a meal.    Yes [provider]  modafinil (PROVIGIL) 200 MG tablet Take 0.5 tablets (100 mg total) daily by mouth. 01/05/17  Yes Dohmeier, Asencion Partridge, MD  pioglitazone (ACTOS) 45 MG tablet Take 45 mg by mouth daily.    Yes [provider]  rOPINIRole (REQUIP) 3 MG tablet Take 1 tablet (3 mg total) at bedtime by mouth. 01/05/17  Yes Dohmeier, Asencion Partridge, MD  rosuvastatin (CRESTOR) 10 MG tablet Take 10 mg by mouth daily. Takes 1 tablet every other day.   Yes [provider]  venlafaxine XR (EFFEXOR-XR) 150 MG 24 hr capsule Take 150 mg by mouth. 01/01/14  Yes [provider]     Family History  Problem Relation Age of Onset  . Lung cancer Father 25  . Coronary artery disease Father   . Heart failure Mother   . Coronary artery disease Mother   . Hypertension Mother   . Hyperlipidemia Mother   .  Heart attack Mother   . Diabetes type II Mother   . Multiple myeloma Son        1 son  . Uterine cancer Daughter        2 daughters total  . Hypertension Brother        3 brothers total  . Hyperlipidemia Brother   . Diabetes type II Brother   . Depression Brother   . Diabetes type II Sister        1sister  . Hyperthyroidism Sister   . Depression Sister   . Obesity Sister   . Depression Daughter     Social History   Socioeconomic History  . Marital status: Divorced    Spouse name: Not on file  . Number of children: 3  . Years of education: Not on file  . Highest education level: Not on file  Social Needs  . Financial resource strain: Not on file  . Food insecurity - worry: Not on file  . Food insecurity - inability: Not on file  .  Transportation needs - medical: Not on file  . Transportation needs - non-medical: Not on file  Occupational History  . Occupation: RETAIL    Comment: [employer]  Tobacco Use  . Smoking status: Never Smoker  . Smokeless tobacco: Never Used  Substance and Sexual Activity  . Alcohol use: Yes    Alcohol/week: 0.0 oz    Comment: occ  . Drug use: No  . Sexual activity: Not on file  Other Topics Concern  . Not on file  Social History Narrative   Right handed.  Married, 3 kids.  Caffeine 1 cups daily.  Retired.     Vital Signs: Ht '5\' 10"'  (1.778 m)   Wt 230 lb (104.3 kg)   BMI 33.00 kg/m   Physical Exam Gen: WD, WN, male in NAD Heart: regular Lungs: CTAB Abd: soft, NT, ND, +BS  Imaging: MRI reviewed in [facility]'s system  Labs:  BMP: Cr: 1.09  Assessment:  1. 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  He denies any urinary complaints.  He had labs prior to this visit while revealed a normal creatinine.  His MRI reveals no enhancement of this lesion c/w no persistent or new malignancy.  The patient will return for his 6 month follow up for a clinic visit as well as a repeat MRI with contrast to once again follow and evaluate this lesion.  Signed: Henreitta Cea, PA-C 03/18/2017, 10:36 AM   Please refer to Dr. Pascal Lux' attestation of this note for management and plan.

## 2017-06-17 ENCOUNTER — Other Ambulatory Visit: Payer: Self-pay | Admitting: Interventional Radiology

## 2017-06-17 DIAGNOSIS — C642 Malignant neoplasm of left kidney, except renal pelvis: Secondary | ICD-10-CM

## 2017-09-22 ENCOUNTER — Ambulatory Visit
Admission: RE | Admit: 2017-09-22 | Discharge: 2017-09-22 | Disposition: A | Discharge status: Home / Self Care | Payer: Medicare Other | Source: Ambulatory Visit | Attending: Interventional Radiology | Admitting: Interventional Radiology

## 2017-09-22 ENCOUNTER — Encounter: Payer: Self-pay | Admitting: *Deleted

## 2017-09-22 DIAGNOSIS — C642 Malignant neoplasm of left kidney, except renal pelvis: Secondary | ICD-10-CM

## 2017-09-22 HISTORY — PX: IR RADIOLOGIST EVAL & MGMT: IMG5224

## 2017-09-22 NOTE — Progress Notes (Signed)
Referring Physician(s): Dr Estill Dooms  Chief Complaint: The patient is seen in follow up today s/p left renal mass biopsy and cryoablation on 12-17-16  History of present illness:  Procedure cryoablation 12/17/16 Doing well from same Denies N/V Fever chills  Unfortunately pt has had back surgery 03/8766 with some complications post surgery Now with incontinence urine and bowel. Below waist paralysis  This has been improving with Rehab over last 6 months-- MD expects further recovery per pt.  Here today for follow up post left renal cryoablation 12/2016   Past Medical History:  Diagnosis Date  . ADD (attention deficit disorder) without hyperactivity   . Anxiety   . Chronic insomnia   . Depression   . Diverticulosis 1997   history of  . DM2 (diabetes mellitus, type 2) ([place])   . Fibromyalgia   . History of pancreatitis 1997  . History of prostate cancer    AGE 67  . HLD (hyperlipidemia)   . Hypercholesterolemia   . Hypertension   . Kidney stones   . Neuropathy   . OSA (obstructive sleep apnea)   . OSA on CPAP 01/15/2014  . Osteoarthritis     Past Surgical History:  Procedure Laterality Date  . CARPAL TUNNEL RELEASE  1998  . CERVICAL SPINE SURGERY  12/2015   Dr. Patrice Paradise  . [place]  . IR RADIOLOGIST EVAL & MGMT  12/02/2016  . IR RADIOLOGIST EVAL & MGMT  03/18/2017  . KNEE SURGERY    . LITHOTRIPSY  2010  . PROSTATECTOMY  2009  . TONSILLECTOMY AND ADENOIDECTOMY      Allergies: Aspirin  Medications: Prior to Admission medications   Medication Sig Start Date End Date Taking? Authorizing Provider  fenofibrate micronized (LOFIBRA) 200 MG capsule TAKE 1 CAP BY MOUTH IN THE MORNING BEFORE BREAKFAST 07/06/16  Yes [provider]  fexofenadine (ALLEGRA) 180 MG tablet Take 180 mg by mouth as needed.    Yes [provider]  Insulin Glargine (BASAGLAR KWIKPEN) 100 UNIT/ML SOPN Inject 20 Units into the skin 2 (two) times daily.   Yes [provider]  losartan-hydrochlorothiazide (HYZAAR) 100-25 MG per tablet Take 1 tablet by mouth daily.   Yes [provider]  metFORMIN (GLUCOPHAGE) 500 MG tablet Take 500 mg by mouth 2 (two) times daily with a meal.    Yes [provider]  modafinil (PROVIGIL) 200 MG tablet Take 0.5 tablets (100 mg total) daily by mouth. 01/05/17  Yes Dohmeier, Asencion Partridge, MD  pioglitazone (ACTOS) 45 MG tablet Take 45 mg by mouth daily.    Yes [provider]  rosuvastatin (CRESTOR) 10 MG tablet Take 10 mg by mouth daily. Takes 1 tablet every other day.   Yes [provider]  venlafaxine XR (EFFEXOR-XR) 150 MG 24 hr capsule Take 150 mg by mouth. 01/01/14  Yes [provider]  rOPINIRole (REQUIP) 3 MG tablet Take 1 tablet (3 mg total) at bedtime by mouth. Patient not taking: Reported on 09/22/2017 01/05/17   Dohmeier, Asencion Partridge, MD     Family History  Problem Relation Age of Onset  . Lung cancer Father 47  . Coronary artery disease Father   . Heart failure Mother   . Coronary artery disease Mother   . Hypertension Mother   . Hyperlipidemia Mother   . Heart attack Mother   . Diabetes type II Mother   . Multiple myeloma Son        1 son  . Uterine cancer Daughter  2 daughters total  . Hypertension Brother        3 brothers total  . Hyperlipidemia Brother   . Diabetes type II Brother   . Depression Brother   . Diabetes type II Sister        1sister  . Hyperthyroidism Sister   . Depression Sister   . Obesity Sister   . Depression Daughter     Social History   Socioeconomic History  . Marital status: Divorced    Spouse name: Not on file  . Number of children: 3  . Years of education: Not on file  . Highest education level: Not on file  Occupational History  . Occupation: RETAIL    Comment: [employer]  . Financial resource strain: Not on file  . Food insecurity:    Worry: Not on file    Inability: Not on file  .  Transportation needs:    Medical: Not on file    Non-medical: Not on file  Tobacco Use  . Smoking status: Never Smoker  . Smokeless tobacco: Never Used  Substance and Sexual Activity  . Alcohol use: Yes    Alcohol/week: 0.0 oz    Comment: occ  . Drug use: No  . Sexual activity: Not on file  Lifestyle  . Physical activity:    Days per week: Not on file    Minutes per session: Not on file  . Stress: Not on file  Relationships  . Social connections:    Talks on phone: Not on file    Gets together: Not on file    Attends religious service: Not on file    Active member of club or organization: Not on file    Attends meetings of clubs or organizations: Not on file    Relationship status: Not on file  Other Topics Concern  . Not on file  Social History Narrative   Right handed.  Married, 3 kids.  Caffeine 1 cups daily.  Retired.     Vital Signs: BP 110/65   Pulse 74   Temp 98.9 F (37.2 C) (Oral)   Resp 15   Ht '5\' 10"'  (1.778 m)   Wt 220 lb (99.8 kg)   SpO2 96%   BMI 31.57 kg/m   Physical Exam  Constitutional: He is oriented to person, place, and time.  Cardiovascular: Normal rate and regular rhythm.  Pulmonary/Chest: Effort normal and breath sounds normal.  Abdominal: Soft. Bowel sounds are normal.  Musculoskeletal:  Moving arms well Can shift seat In wheel chair easily with arm strength Legs are without movement    Neurological: He is alert and oriented to person, place, and time.  Skin: Skin is warm and dry.  Psychiatric: He has a normal mood and affect. His behavior is normal.  Vitals reviewed.   Imaging: No results found.   MRI 09/21/17: IMPRESSION: 1. Expected post ablation appearance along the left mid kidney posteriorly, without recurrent malignancy. 2. Small complex cyst of the left kidney upper pole, stable. 3. Diffuse hepatic steatosis. 4. Moderate splenomegaly without focal splenic lesion identified. Cause of splenomegaly uncertain. 5.  Scattered colonic diverticulosis. 6. Small lipoma within the descending duodenum. 7. Aortic Atherosclerosis (ICD10-I70.0). 8. Lumbar spondylosis and degenerative disc disease with evidence of postoperative findings in the lumbar spine   Labs:  CBC: No results for input(s): WBC, HGB, HCT, PLT in the last 8760 hours.  COAGS: No results for input(s): INR, APTT in the last 8760 hours.  BMP:  No results for input(s): NA, K, CL, CO2, GLUCOSE, BUN, CALCIUM, CREATININE, GFRNONAA, GFRAA in the last 8760 hours.  Invalid input(s): CMP  LIVER FUNCTION TESTS: No results for input(s): BILITOT, AST, ALT, ALKPHOS, PROT, ALBUMIN in the last 8760 hours.  Assessment:  Post Left renal cryoablation with Dr Pascal Lux 12/17/16 Imaging reviewed with pt and wife-- he has no complaints post ablation procedure MR shows no malignancy recurrence Will image with MRI again in 6 months (approx Feb 2020) Pt and wife have good understanding of this plan and agree  Signed: Corra Kaine A, PA-C 09/22/2017, 1:25 PM   Please refer to Dr. Pascal Lux attestation of this note for management and plan.

## 2017-11-01 IMAGING — CT CT HEAD W/O CM
3 of 4 series · 17 of 40 positions shown, 19 images · non-contrast
Comparison: None.

CLINICAL DATA: Head trauma 1 month ago with continued pain. Initial
encounter.

EXAM:
CT HEAD WITHOUT CONTRAST
TECHNIQUE: Contiguous axial images were obtained from the base of the skull
through the vertex without intravenous contrast.

[Series 3: head w/out-thins · axial · 0.46mm/px · z∈[-144,-4]mm · 8 of 172 slices shown]
[im 21/172  brain]
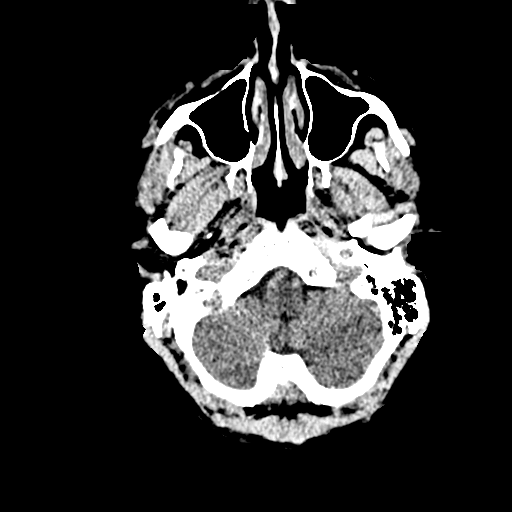
[im 41/172  brain]
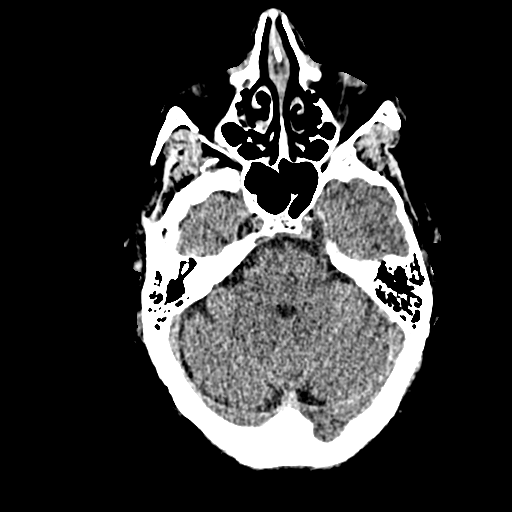
[im 61/172  brain]
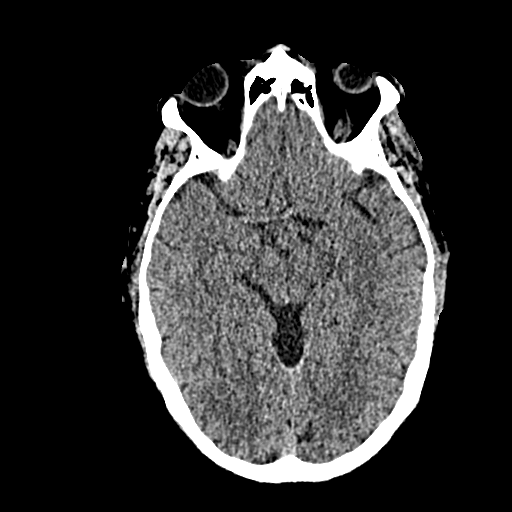
[im 81/172  brain]
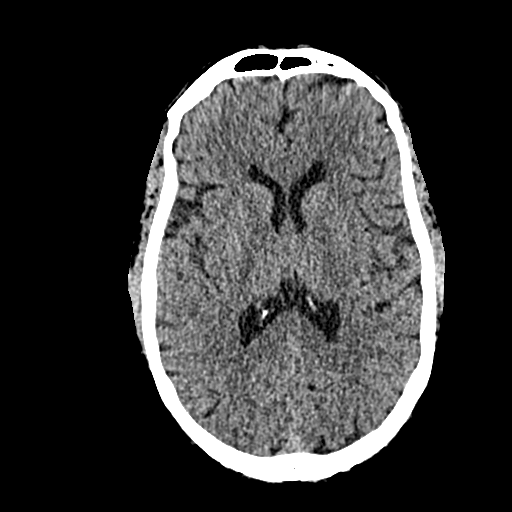
[im 101/172  brain]
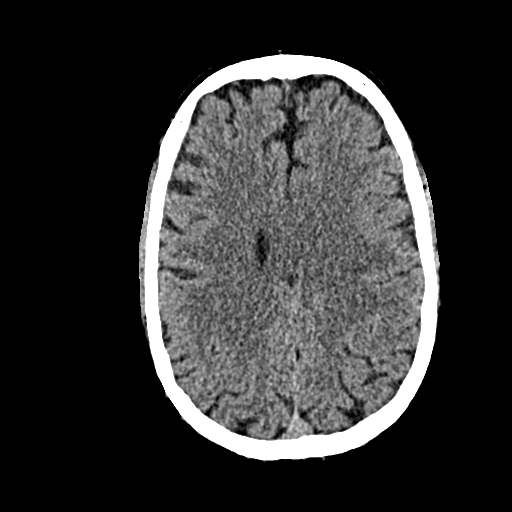
[im 121/172  brain]
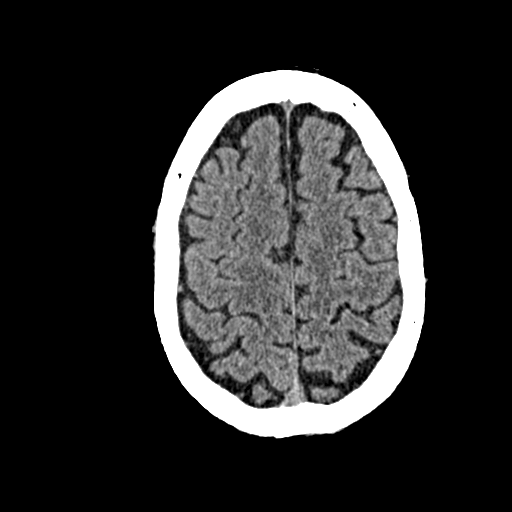
[im 141/172  brain]
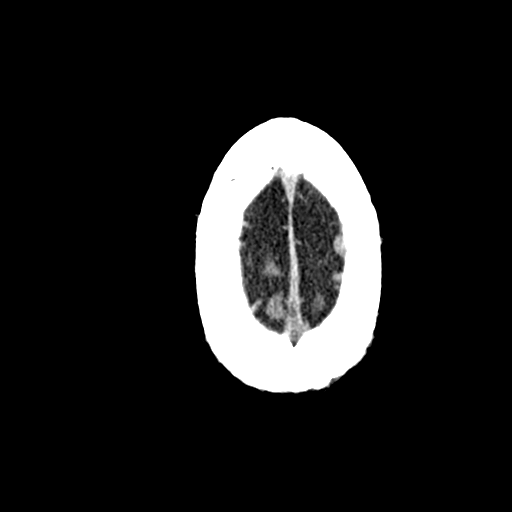
[im 161/172  brain]
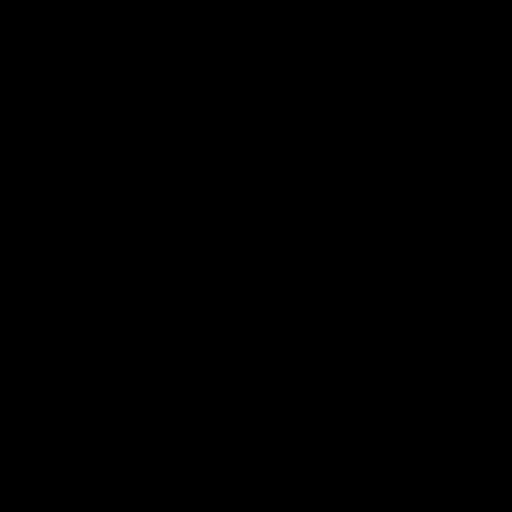

[Series 602: coronal · axial · 0.46mm/px · z∈[-139,-29]mm · 6 of 78 slices shown, 8 images]
[im 12/78  brain]
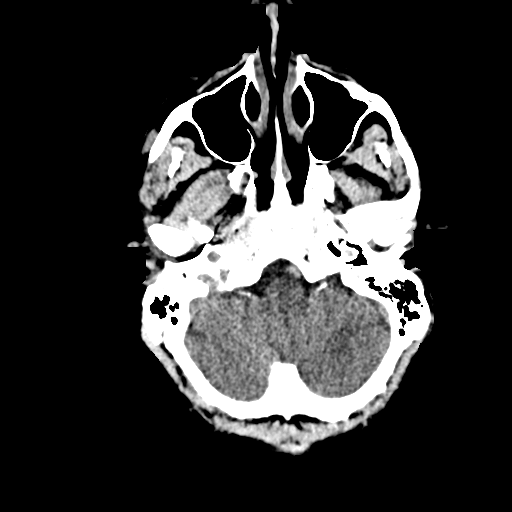
[im 12/78  bone]
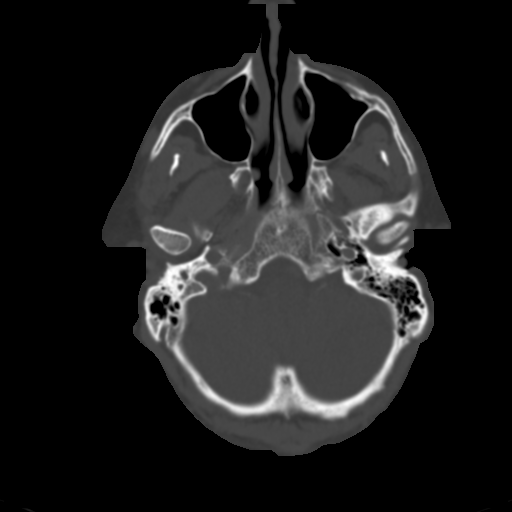
[im 23/78  brain]
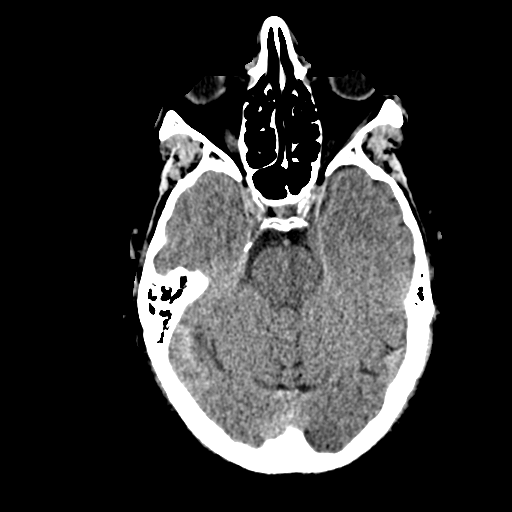
[im 34/78  brain]
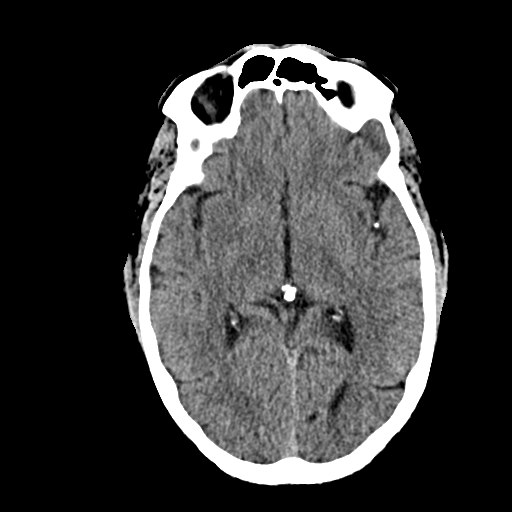
[im 45/78  brain]
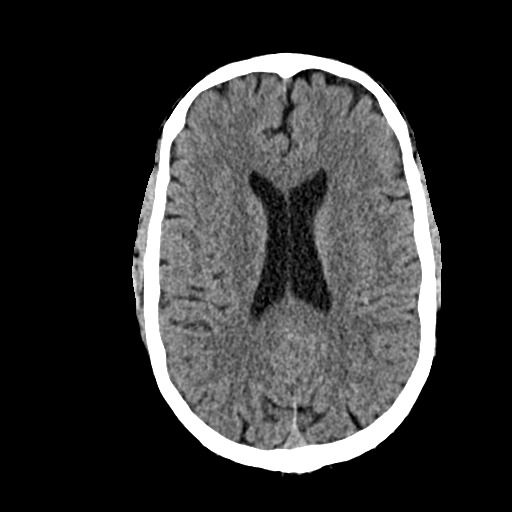
[im 56/78  brain]
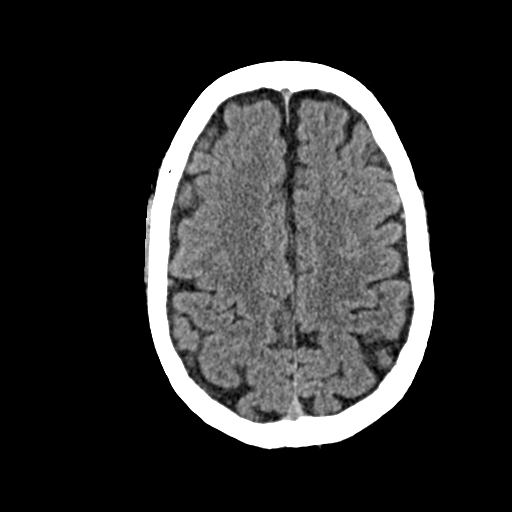
[im 56/78  bone]
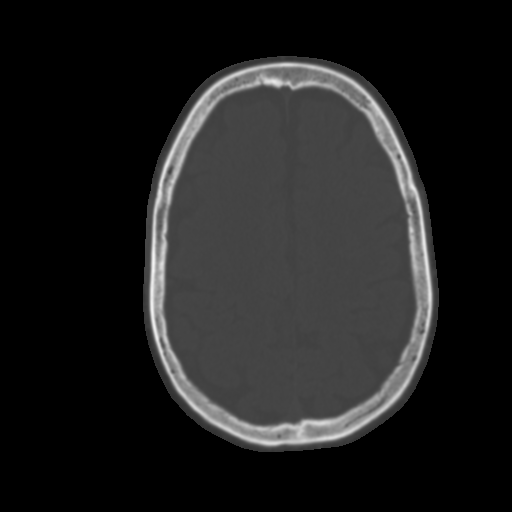
[im 67/78  brain]
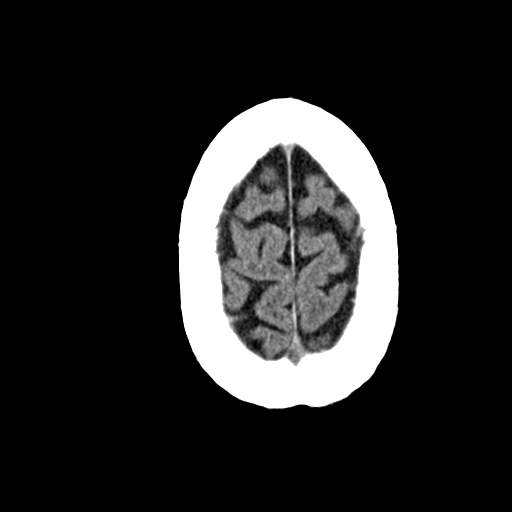

[Series 603: sagittal · coronal · 0.46mm/px · 3 of 112 slices shown]
[im 97/112  brain]
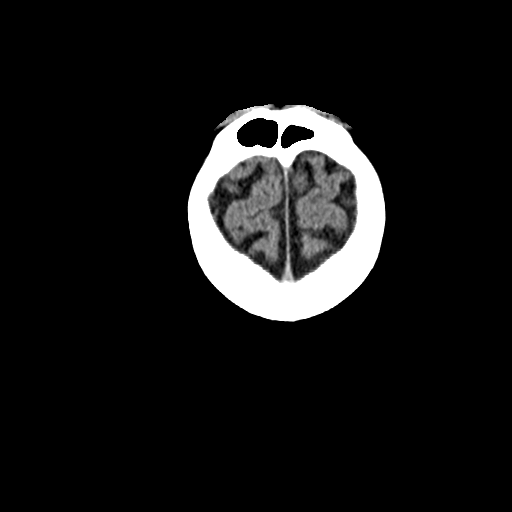
[im 99/112  brain]
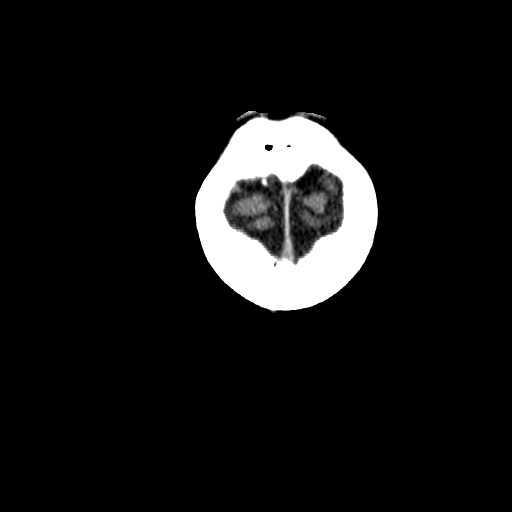
[im 102/112  brain]
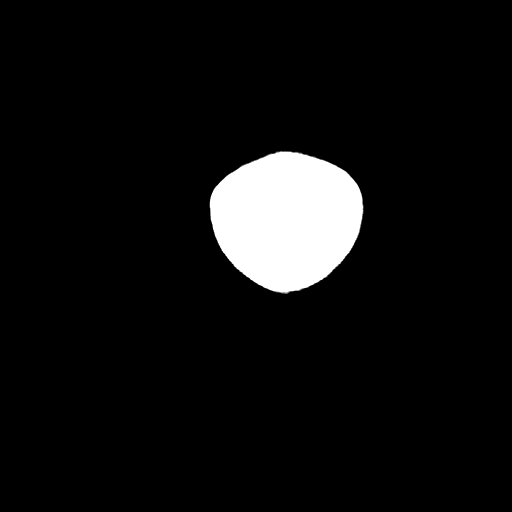

[17 of 40 positions shown; findings below may reference images not displayed]

FINDINGS: Brain: Left frontal subcortical 8 mm high-density area with wispy
appearance. No surrounding edema. No evidence of infarct or
hydrocephalus. There are coarse calcifications in the left sylvian
fissure and right occipital sulci which could be atherosclerotic or
postinflammatory.

Vascular: Atherosclerotic calcification.

Skull: Negative for fracture

Sinuses/Orbits: Negative

Other: These results will be called to the ordering clinician or
representative by the Radiologist Assistant, and communication
documented in the PACS or zVision Dashboard.
IMPRESSION: 8 mm high-density lesion in the left frontal white matter. Given 1
month interval and lack of edema, favor calcified lesion over
posttraumatic hemorrhage. A cavernoma in particular can have this
appearance. Recommend brain MRI with contrast for more definitive
characterization.

## 2018-01-04 ENCOUNTER — Encounter: Payer: Self-pay | Admitting: Adult Health

## 2018-01-06 ENCOUNTER — Ambulatory Visit (INDEPENDENT_AMBULATORY_CARE_PROVIDER_SITE_OTHER): Payer: Medicare Other | Admitting: Adult Health

## 2018-01-06 ENCOUNTER — Encounter: Payer: Self-pay | Admitting: Adult Health

## 2018-01-06 VITALS — BP 135/69 | HR 64 | Ht 70.0 in

## 2018-01-06 DIAGNOSIS — Z9989 Dependence on other enabling machines and devices: Secondary | ICD-10-CM

## 2018-01-06 DIAGNOSIS — G4733 Obstructive sleep apnea (adult) (pediatric): Secondary | ICD-10-CM

## 2018-01-06 NOTE — Patient Instructions (Signed)

## 2018-01-06 NOTE — Progress Notes (Signed)
PATIENT: Bradley Medina DOB: 12-May-1950  REASON FOR VISIT: follow up HISTORY FROM: patient  HISTORY OF PRESENT ILLNESS: Today 01/06/18:  Mr. Bradley Medina is a 67 year old male with a history of obstructive sleep apnea on CPAP.  He returns today for follow-up.  His CPAP download indicates that he uses machine every night for compliance of 100%.  He uses machine greater than 4 hours each night.  On average he uses his machine 9 hours.  His residual AHI is 3.4 on 12 cmH2O with EPR of 3.  He reports that the CPAP continues to work well.  He does report in February he had back surgery at [facility] and developed a hematoma had to have a second surgery and then developed paralysis from the waist down.  He states that since then he has regained sensation in the legs and some strength but he still unable to ambulate.  He returns today for evaluation.      REVIEW OF SYSTEMS: Out of a complete 14 system review of symptoms, the patient complains only of the following symptoms, and all other reviewed systems are negative.  See HPI  ALLERGIES: Allergies  Allergen Reactions  . Aspirin     Chronically Low platelets    HOME MEDICATIONS: Outpatient Medications Prior to Visit  Medication Sig Dispense Refill  . fenofibrate micronized (LOFIBRA) 200 MG capsule TAKE 1 CAP BY MOUTH IN THE MORNING BEFORE BREAKFAST    . fexofenadine (ALLEGRA) 180 MG tablet Take 180 mg by mouth as needed.     . gabapentin (NEURONTIN) 800 MG tablet Take 800 mg by mouth 3 (three) times daily.    . Insulin Glargine (BASAGLAR KWIKPEN) 100 UNIT/ML SOPN Inject 20 Units into the skin 2 (two) times daily.    Marland Kitchen losartan-hydrochlorothiazide (HYZAAR) 100-25 MG per tablet Take 1 tablet by mouth daily.    . metFORMIN (GLUCOPHAGE) 500 MG tablet Take 500 mg by mouth 2 (two) times daily with a meal.     . pioglitazone (ACTOS) 45 MG tablet Take 45 mg by mouth daily.     . rosuvastatin (CRESTOR) 10 MG tablet Take 10 mg by mouth  daily. Takes 1 tablet every other day.    . venlafaxine XR (EFFEXOR-XR) 150 MG 24 hr capsule Take 150 mg by mouth.    . modafinil (PROVIGIL) 200 MG tablet Take 0.5 tablets (100 mg total) daily by mouth. (Patient not taking: Reported on 01/06/2018) 90 tablet 3  . rOPINIRole (REQUIP) 3 MG tablet Take 1 tablet (3 mg total) at bedtime by mouth. (Patient not taking: Reported on 09/22/2017) 90 tablet 3   No facility-administered medications prior to visit.     PAST MEDICAL HISTORY: Past Medical History:  Diagnosis Date  . ADD (attention deficit disorder) without hyperactivity   . Anxiety   . Chronic insomnia   . Depression   . Diverticulosis 1997   history of  . DM2 (diabetes mellitus, type 2) ([place])   . Fibromyalgia   . History of pancreatitis 1997  . History of prostate cancer    AGE 51  . HLD (hyperlipidemia)   . Hypercholesterolemia   . Hypertension   . Kidney stones   . Neuropathy   . OSA (obstructive sleep apnea)   . OSA on CPAP 01/15/2014  . Osteoarthritis     PAST SURGICAL HISTORY: Past Surgical History:  Procedure Laterality Date  . CARPAL TUNNEL RELEASE  1998  . CERVICAL SPINE SURGERY  12/2015   Dr.  Hitchcock  . IR RADIOLOGIST EVAL & MGMT  12/02/2016  . IR RADIOLOGIST EVAL & MGMT  03/18/2017  . IR RADIOLOGIST EVAL & MGMT  09/22/2017  . KNEE SURGERY    . LITHOTRIPSY  2010  . PROSTATECTOMY  2009  . TONSILLECTOMY AND ADENOIDECTOMY      FAMILY HISTORY: Family History  Problem Relation Age of Onset  . Lung cancer Father 51  . Coronary artery disease Father   . Heart failure Mother   . Coronary artery disease Mother   . Hypertension Mother   . Hyperlipidemia Mother   . Heart attack Mother   . Diabetes type II Mother   . Multiple myeloma Son        1 son  . Uterine cancer Daughter        2 daughters total  . Hypertension Brother        3 brothers total  . Hyperlipidemia Brother   . Diabetes type II Brother   . Depression Brother   .  Diabetes type II Sister        1sister  . Hyperthyroidism Sister   . Depression Sister   . Obesity Sister   . Depression Daughter     SOCIAL HISTORY: Social History   Socioeconomic History  . Marital status: Divorced    Spouse name: Not on file  . Number of children: 3  . Years of education: Not on file  . Highest education level: Not on file  Occupational History  . Occupation: RETAIL    Comment: [employer]  . Financial resource strain: Not on file  . Food insecurity:    Worry: Not on file    Inability: Not on file  . Transportation needs:    Medical: Not on file    Non-medical: Not on file  Tobacco Use  . Smoking status: Never Smoker  . Smokeless tobacco: Never Used  Substance and Sexual Activity  . Alcohol use: Yes    Alcohol/week: 0.0 standard drinks    Comment: occ  . Drug use: No  . Sexual activity: Not on file  Lifestyle  . Physical activity:    Days per week: Not on file    Minutes per session: Not on file  . Stress: Not on file  Relationships  . Social connections:    Talks on phone: Not on file    Gets together: Not on file    Attends religious service: Not on file    Active member of club or organization: Not on file    Attends meetings of clubs or organizations: Not on file    Relationship status: Not on file  . Intimate partner violence:    Fear of current or ex partner: Not on file    Emotionally abused: Not on file    Physically abused: Not on file    Forced sexual activity: Not on file  Other Topics Concern  . Not on file  Social History Narrative   Right handed.  Married, 3 kids.  Caffeine 1 cups daily.  Retired.      PHYSICAL EXAM  Vitals:   01/06/18 1257  BP: 135/69  Pulse: 64  Height: _0  (1.778 m)   Body mass index is 31.57 kg/m.  Generalized: Well developed, in no acute distress   Neurological examination  Mentation: Alert oriented to time, place, history taking. Follows all commands speech and  language fluent Cranial nerve II-XII: Pupils were  equal round reactive to light. Extraocular movements were full, visual field were full on confrontational test. Facial sensation and strength were normal. Uvula tongue midline. Head turning and shoulder shrug  were normal and symmetric.  Neck circumference 17 inches, Mallampati 2+ Motor: The motor testing reveals good strength in a sitting position.  Patient is unable to stand. Sensory: Sensory testing is intact to soft touch on all 4 extremities. No evidence of extinction is noted.  Coordination: Cerebellar testing reveals good finger-nose-finger Gait and station: Patient is in a wheelchair. Marland Kitchen   DIAGNOSTIC DATA (LABS, IMAGING, TESTING) - I reviewed patient records, labs, notes, testing and imaging myself where available.  Lab Results  Component Value Date   WBC 6.1 04/07/2011   HGB 14.8 04/07/2011   HCT 42.0 04/07/2011   MCV 93.9 04/07/2011   PLT 50.0 (L) 04/07/2011      ASSESSMENT AND PLAN 67 y.o. year old male  has a past medical history of ADD (attention deficit disorder) without hyperactivity, Anxiety, Chronic insomnia, Depression, Diverticulosis (1997), DM2 (diabetes mellitus, type 2) ([place]), Fibromyalgia, History of pancreatitis (1997), History of prostate cancer, HLD (hyperlipidemia), Hypercholesterolemia, Hypertension, Kidney stones, Neuropathy, OSA (obstructive sleep apnea), OSA on CPAP (01/15/2014), and Osteoarthritis. here with:  1.  Obstructive sleep apnea on CPAP  Patient CPAP download shows excellent compliance and good treatment of his apnea.  He is encouraged to continue using the CPAP nightly and greater than 4 hours each night.  He is advised that if his symptoms worsen or he develops new symptoms he should let us know.  He will follow-up in 1 year or sooner if needed.   I spent 15 minutes with the patient. 50% of this time was spent reviewing CPAP download   Ward Givens, MSN, NP-C 01/06/2018, 1:11 PM [facility] [address] [place], [place] [address] [phone]

## 2018-04-06 ENCOUNTER — Other Ambulatory Visit: Payer: Self-pay | Admitting: Radiology

## 2018-04-06 ENCOUNTER — Other Ambulatory Visit: Payer: Self-pay | Admitting: Interventional Radiology

## 2018-04-06 DIAGNOSIS — N2889 Other specified disorders of kidney and ureter: Secondary | ICD-10-CM

## 2018-05-10 ENCOUNTER — Other Ambulatory Visit: Payer: Medicare Other

## 2018-05-11 ENCOUNTER — Other Ambulatory Visit: Payer: Medicare Other

## 2018-07-23 IMAGING — CR DG HIP (WITH OR WITHOUT PELVIS) 5+V BILAT
3 series · 3 of 3 positions shown · non-contrast
Comparison: None.

CLINICAL DATA: Bilateral hip pain right greater than left for
several years, initial encounter

EXAM:
DG HIP (WITH OR WITHOUT PELVIS) 5+V BILAT

[w pelvis]
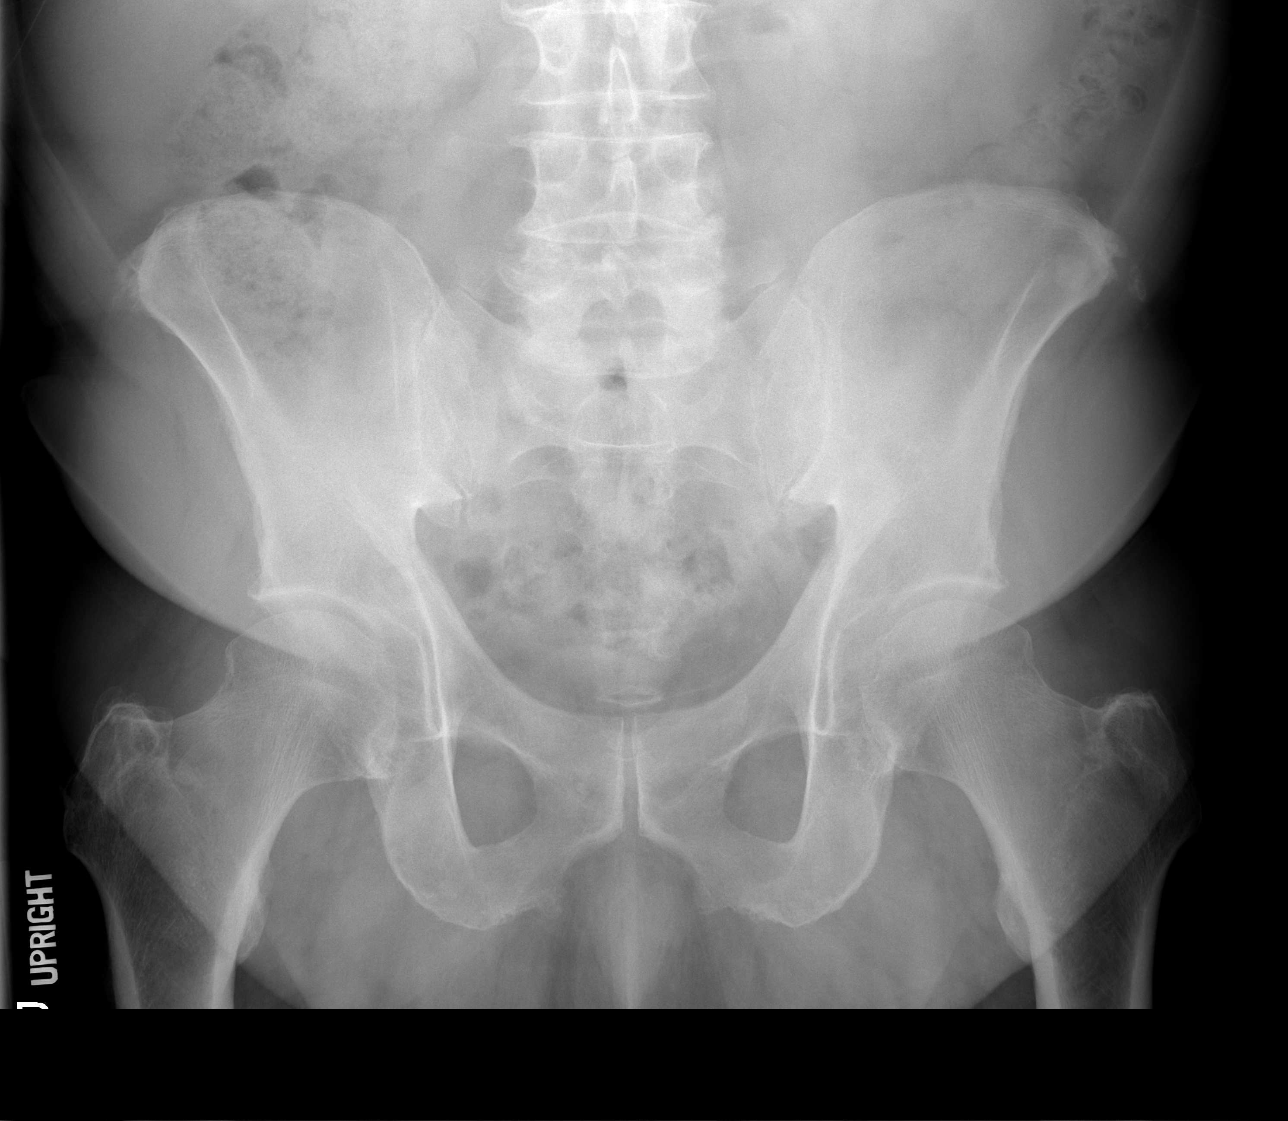

[w pelvis * (1 of 2)]
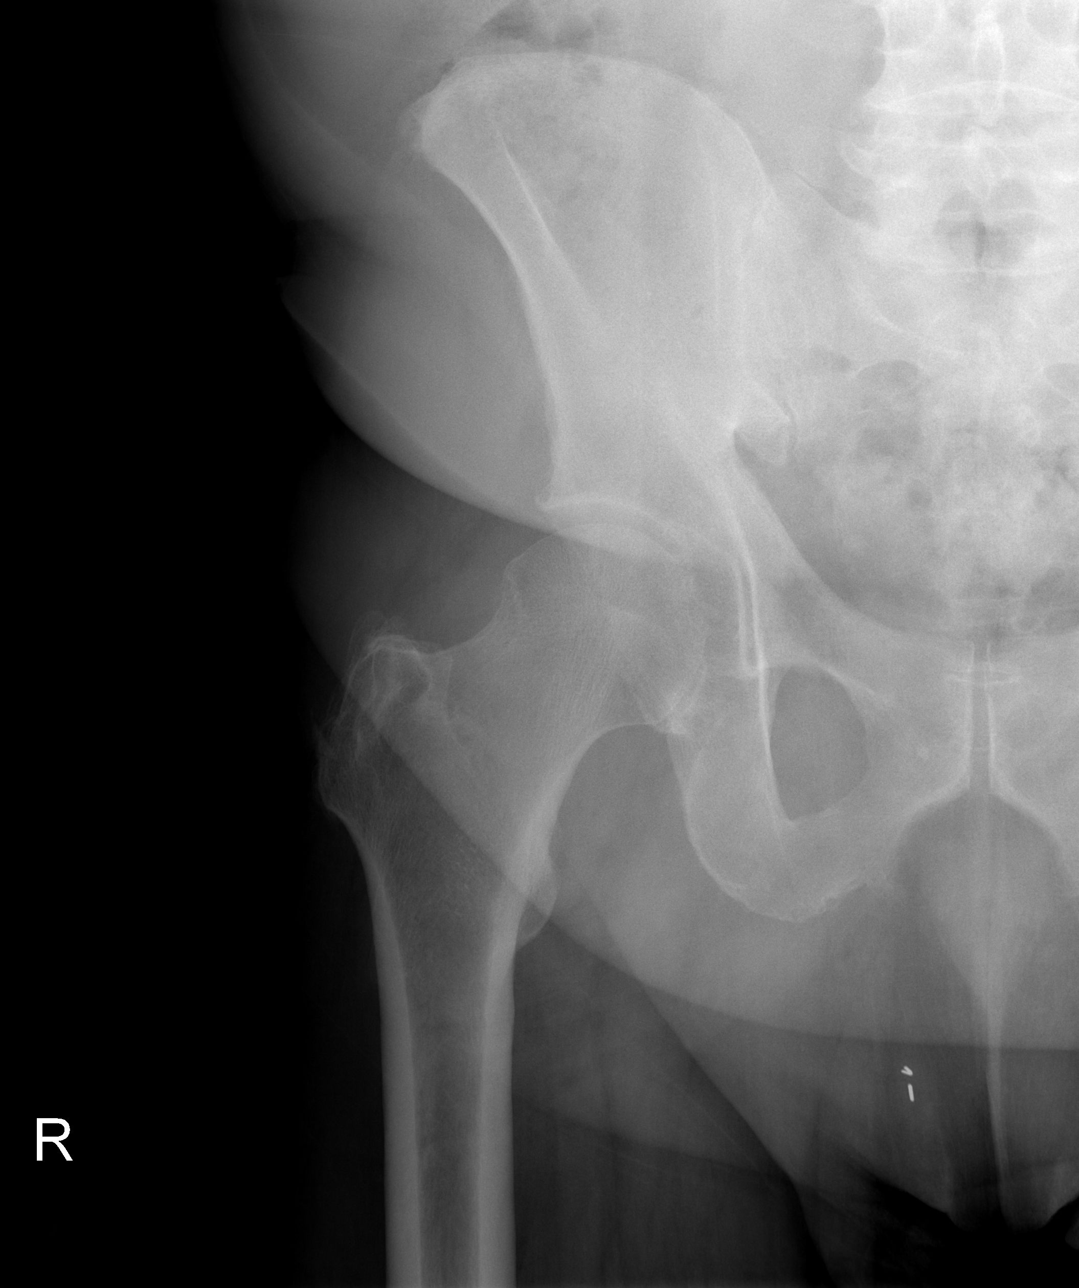

[w pelvis * (2 of 2)]
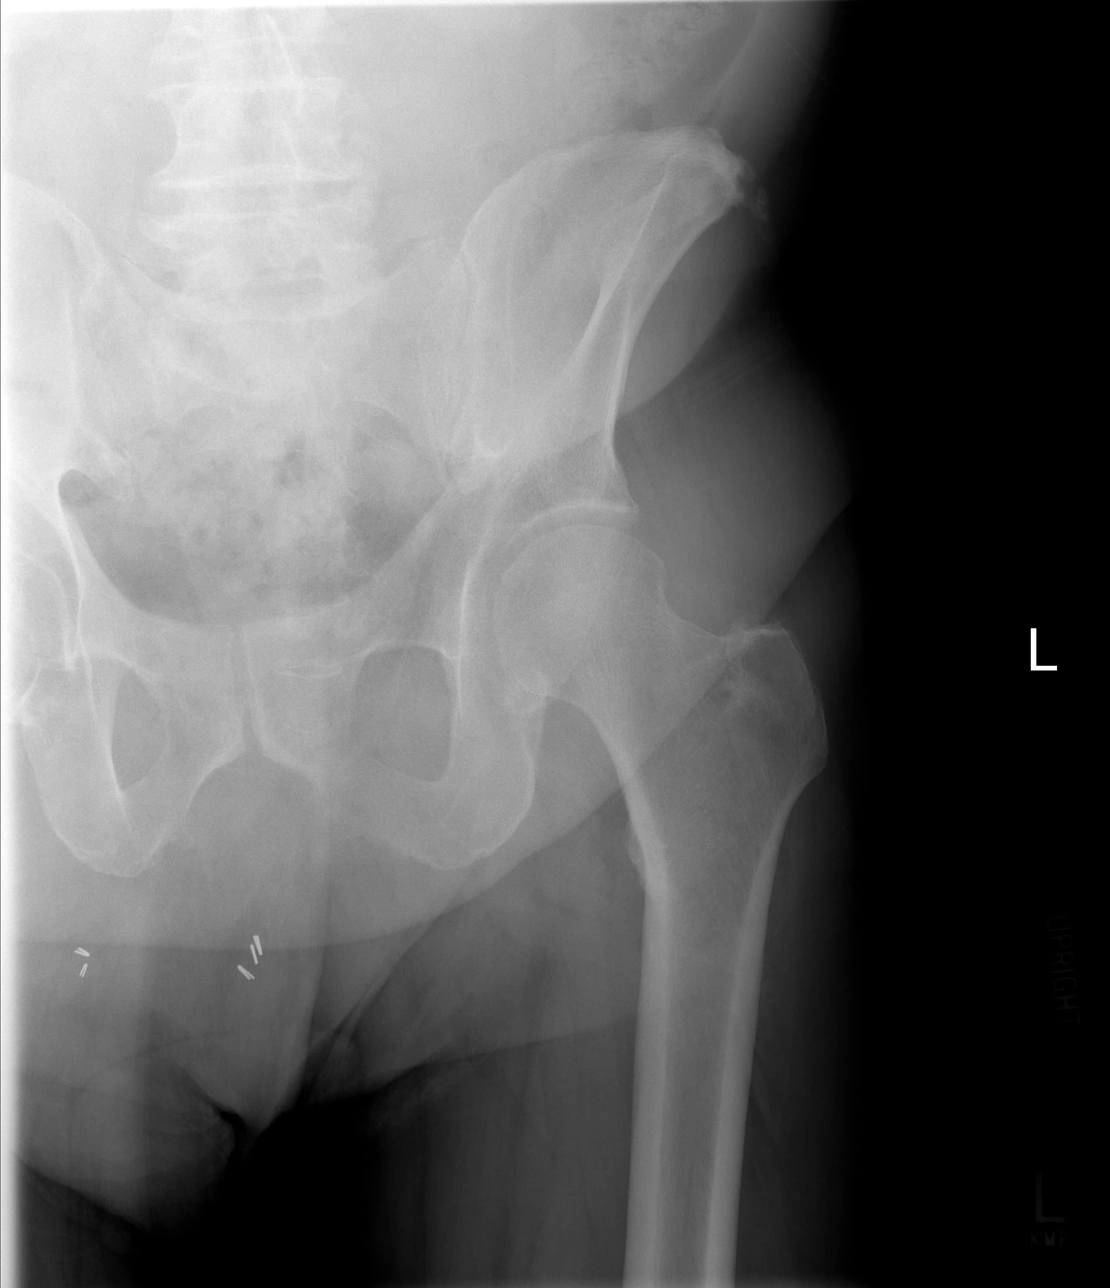

[3 of 3 positions shown; findings below may reference images not displayed]

FINDINGS: Pelvic ring is intact. Mild degenerative changes of the hip joints
are noted bilaterally. No acute fracture or dislocation is noted. No
soft tissue abnormality is seen. Changes consistent with prior
vasectomy.
IMPRESSION: Mild degenerative change without acute abnormality.

## 2018-09-06 ENCOUNTER — Ambulatory Visit
Admission: RE | Admit: 2018-09-06 | Discharge: 2018-09-06 | Disposition: A | Discharge status: Home / Self Care | Payer: Medicare Other | Source: Ambulatory Visit | Attending: Interventional Radiology | Admitting: Interventional Radiology

## 2018-09-06 ENCOUNTER — Encounter: Payer: Self-pay | Admitting: *Deleted

## 2018-09-06 ENCOUNTER — Other Ambulatory Visit: Payer: Self-pay

## 2018-09-06 DIAGNOSIS — N2889 Other specified disorders of kidney and ureter: Secondary | ICD-10-CM

## 2018-09-06 HISTORY — PX: IR RADIOLOGIST EVAL & MGMT: IMG5224

## 2018-09-06 NOTE — Progress Notes (Signed)
Patient ID: Bradley Medina, male   DOB: 04-May-1950, 68 y.o.   MRN: [id number]         Chief Complaint: Post left-sided renal cryoablation (performed December 17, 2016)   Referring Physician(s): Eskew  History of Present Illness: Bradley Medina is a 68 y.o. 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  Patient is seen today in video consult following the acquisition of surveillance abdominal MRI performed 09/15/2018.  Patient remains without complaint in regards to his left-sided renal lesion.    Past Medical History:  Diagnosis Date   ADD (attention deficit disorder) without hyperactivity    Anxiety    Chronic insomnia    Depression    Diverticulosis 1997   history of   DM2 (diabetes mellitus, type 2) ([place])    Fibromyalgia    History of pancreatitis 1997   History of prostate cancer    AGE 30   HLD (hyperlipidemia)    Hypercholesterolemia    Hypertension    Kidney stones    Neuropathy    OSA (obstructive sleep apnea)    OSA on CPAP 01/15/2014   Osteoarthritis     Past Surgical History:  Procedure Laterality Date   CARPAL TUNNEL RELEASE  1998   CERVICAL SPINE SURGERY  12/2015   Dr. Patrice Paradise   HEMORRHOID SURGERY  1979   IR RADIOLOGIST EVAL & MGMT  12/02/2016   IR RADIOLOGIST EVAL & MGMT  03/18/2017   IR RADIOLOGIST EVAL & MGMT  09/22/2017   KNEE SURGERY     LITHOTRIPSY  2010   PROSTATECTOMY  2009   TONSILLECTOMY AND ADENOIDECTOMY      Allergies: Aspirin  Medications: Prior to Admission medications   Medication Sig Start Date End Date Taking? Authorizing Provider  fenofibrate micronized (LOFIBRA) 200 MG capsule TAKE 1 CAP BY MOUTH IN THE MORNING BEFORE BREAKFAST 07/06/16    [provider]  fexofenadine (ALLEGRA) 180 MG tablet Take 180 mg by mouth as needed.     [provider]  gabapentin (NEURONTIN) 800 MG tablet Take 800 mg by mouth 3 (three) times daily. 09/16/17   [provider]  Insulin Glargine (BASAGLAR KWIKPEN) 100 UNIT/ML SOPN Inject 20 Units into the skin 2 (two) times daily.    [provider]  losartan-hydrochlorothiazide (HYZAAR) 100-25 MG per tablet Take 1 tablet by mouth daily.    [provider]  metFORMIN (GLUCOPHAGE) 500 MG tablet Take 500 mg by mouth 2 (two) times daily with a meal.     [provider]  pioglitazone (ACTOS) 45 MG tablet Take 45 mg by mouth daily.     [provider]  rosuvastatin (CRESTOR) 10 MG tablet Take 10 mg by mouth daily. Takes 1 tablet every other day.    [provider]  venlafaxine XR (EFFEXOR-XR) 150 MG 24 hr capsule Take 150 mg by mouth. 01/01/14   [provider]     Family History  Problem Relation Age of Onset   Lung cancer Father 37   Coronary artery disease Father    Heart failure Mother    Coronary artery disease Mother    Hypertension Mother    Hyperlipidemia Mother    Heart attack Mother    Diabetes type II Mother    Multiple myeloma Son  1 son   Uterine cancer Daughter        2 daughters total   Hypertension Brother        3 brothers total   Hyperlipidemia Brother    Diabetes type II Brother    Depression Brother    Diabetes type II Sister        1sister   Hyperthyroidism Sister    Depression Sister    Obesity Sister    Depression Daughter     Social History   Socioeconomic History   Marital status: Divorced    Spouse name: Not on file   Number of children: 3   Years of education: Not on file   Highest education level: Not on file  Occupational History   Occupation: RETAIL    Comment: Paramedic strain: Not on file   Food  insecurity    Worry: Not on file    Inability: Not on file   Transportation needs    Medical: Not on file    Non-medical: Not on file  Tobacco Use   Smoking status: Never Smoker   Smokeless tobacco: Never Used  Substance and Sexual Activity   Alcohol use: Yes    Alcohol/week: 0.0 standard drinks    Comment: occ   Drug use: No   Sexual activity: Not on file  Lifestyle   Physical activity    Days per week: Not on file    Minutes per session: Not on file   Stress: Not on file  Relationships   Social connections    Talks on phone: Not on file    Gets together: Not on file    Attends religious service: Not on file    Active member of club or organization: Not on file    Attends meetings of clubs or organizations: Not on file    Relationship status: Not on file  Other Topics Concern   Not on file  Social History Narrative   Right handed.  Married, 3 kids.  Caffeine 1 cups daily.  Retired.    ECOG Status: 1 - Symptomatic but completely ambulatory  Review of Systems  Genitourinary: Negative for dysuria, flank pain and hematuria.    Review of Systems: A 12 point ROS discussed and pertinent positives are indicated in the HPI above.  All other systems are negative.  Physical Exam No direct physical exam was performed (except for noted visual exam findings with Video Visits).   Vital Signs: There were no vitals taken for this visit.  Imaging:  Abdominal MRI - 08/29/2018 - "Stable cryoablation defect in the left kidney. No evidence of recurrent renal carcinoma or abdominal metastatic disease."  Labs:  CBC: No results for input(s): WBC, HGB, HCT, PLT in the last 8760 hours.  COAGS: No results for input(s): INR, APTT in the last 8760 hours.  BMP: No results for input(s): NA, K, CL, CO2, GLUCOSE, BUN, CALCIUM, CREATININE, GFRNONAA, GFRAA in the last 8760 hours.  Invalid input(s): CMP  LIVER FUNCTION TESTS: No results for input(s): BILITOT, AST, ALT,  ALKPHOS, PROT, ALBUMIN in the last 8760 hours.  TUMOR MARKERS: No results for input(s): AFPTM, CEA, CA199, CHROMGRNA in the last 8760 hours.  Assessment and Plan:  Bradley Medina is a 68 y.o. male with past medical history significant for thrombocytopenia, diabetes, prostate cancer, pancreatitis, hyperlipidemia, hypertension and obstructive sleep apnea who underwent technically successful left-sided renal cryoablation and biopsy on 12/17/2016 (pathology compatible with clear cell  renal cell carcinoma).    Fortunately, surveillance abdominal MRI performed 08/29/2018 is again without evidence of residual or locally recurrent disease following left-sided renal cryoablation.    This examination documents nearly 20 months of stability.  PLAN: - Final surveillance abdominal MRI will be performed 3 years following the initial cryoablation (November 2021).  Patient knows to call the interventional radiology clinic with any interval questions or concerns  A copy of this report was sent to the requesting provider on this date.  Electronically Signed: Sandi Mariscal 09/06/2018, 9:25 AM   I spent a total of 10 Minutes in remote  clinical consultation, greater than 50% of which was counseling/coordinating care for post left-sided renal cryoablation.    Visit type: Audio only (telephone). Audio (no video) only due to patient's lack of internet/smartphone capability. Alternative for in-person consultation at [facility], [address] [place], [place], [place]. This visit type was conducted due to national recommendations for restrictions regarding the COVID-19 Pandemic (e.g. social distancing).  This format is felt to be most appropriate for this patient at this time.  All issues noted in this document were discussed and addressed.

## 2019-01-09 ENCOUNTER — Ambulatory Visit: Payer: Medicare Other | Admitting: Adult Health

## 2019-03-27 ENCOUNTER — Encounter: Payer: Self-pay | Admitting: Neurology

## 2019-03-30 ENCOUNTER — Encounter: Payer: Self-pay | Admitting: Adult Health

## 2019-03-30 ENCOUNTER — Telehealth (INDEPENDENT_AMBULATORY_CARE_PROVIDER_SITE_OTHER): Payer: Medicare Other | Admitting: Adult Health

## 2019-03-30 DIAGNOSIS — G4733 Obstructive sleep apnea (adult) (pediatric): Secondary | ICD-10-CM | POA: Diagnosis not present

## 2019-03-30 DIAGNOSIS — Z9989 Dependence on other enabling machines and devices: Secondary | ICD-10-CM

## 2019-03-30 NOTE — Progress Notes (Signed)
PATIENT: Bradley Medina DOB: 17-Jun-1950  REASON FOR VISIT: follow up HISTORY FROM: patient  Virtual Visit via Video Note  I connected with Bradley Medina on 03/30/19 at  1:00 PM EST by a video enabled telemedicine application located remotely at [facility] and verified that I am speaking with the correct person using two identifiers who was located at their own home.   I discussed the limitations of evaluation and management by telemedicine and the availability of in person appointments. The patient expressed understanding and agreed to proceed.   PATIENT: Bradley Medina DOB: [birth year]  REASON FOR VISIT: follow up HISTORY FROM: patient  HISTORY OF PRESENT ILLNESS: Today 03/30/19:  Bradley Medina is a 69 year old male with a history of obstructive sleep apnea on CPAP.  His download indicates that he uses machine 29 out of 30 days for compliance of 97%.  He uses machine greater than 4 hours 29 days for compliance of 97%.  On average he uses his machine 8 hours and 44 minutes.  His residual AHI is 1.6 on 12 cm of water with EPR of 3.  He does not have a significant leak.  He reports that the CPAP is working well for him.  He returns today for an evaluation.  HISTORY:  Bradley Medina is a 69 year old male with a history of obstructive sleep apnea on CPAP.  He returns today for follow-up.  His CPAP download indicates that he uses machine every night for compliance of 100%.  He uses machine greater than 4 hours each night.  On average he uses his machine 9 hours.  His residual AHI is 3.4 on 12 cmH2O with EPR of 3.  He reports that the CPAP continues to work well.  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  He states that since then he has regained sensation in the legs and some strength but he still unable to ambulate.  He returns today for  evaluation.  REVIEW OF SYSTEMS: Out of a complete 14 system review of symptoms, the patient complains only of the following symptoms, and all other reviewed systems are negative.  See HPI  ALLERGIES: Allergies  Allergen Reactions  . Aspirin     Chronically Low platelets    HOME MEDICATIONS: Outpatient Medications Prior to Visit  Medication Sig Dispense Refill  . fenofibrate micronized (LOFIBRA) 200 MG capsule TAKE 1 CAP BY MOUTH IN THE MORNING BEFORE BREAKFAST    . fexofenadine (ALLEGRA) 180 MG tablet Take 180 mg by mouth as needed.     . gabapentin (NEURONTIN) 800 MG tablet Take 800 mg by mouth 3 (three) times daily.    . Insulin Glargine (BASAGLAR KWIKPEN) 100 UNIT/ML SOPN Inject 20 Units into the skin 2 (two) times daily.    Marland Kitchen losartan-hydrochlorothiazide (HYZAAR) 100-25 MG per tablet Take 1 tablet by mouth daily.    . metFORMIN (GLUCOPHAGE) 500 MG tablet Take 500 mg by mouth 2 (two) times daily with a meal.     . pioglitazone (ACTOS) 45 MG tablet Take 45 mg by mouth daily.     . rosuvastatin (CRESTOR) 10 MG tablet Take 10 mg by mouth daily. Takes 1 tablet every other day.    . venlafaxine XR (EFFEXOR-XR) 150 MG 24 hr capsule Take 150 mg by mouth.     No facility-administered medications prior  to visit.    PAST MEDICAL HISTORY: Past Medical History:  Diagnosis Date  . ADD (attention deficit disorder) without hyperactivity   . Anxiety   . Chronic insomnia   . Depression   . Diverticulosis 1997   history of  . DM2 (diabetes mellitus, type 2) ([place])   . Fibromyalgia   . History of pancreatitis 1997  . History of prostate cancer    AGE 35  . HLD (hyperlipidemia)   . Hypercholesterolemia   . Hypertension   . Kidney stones   . Neuropathy   . OSA (obstructive sleep apnea)   . OSA on CPAP 01/15/2014  . Osteoarthritis     PAST SURGICAL HISTORY: Past Surgical History:  Procedure Laterality Date  . CARPAL TUNNEL RELEASE  1998  . CERVICAL SPINE SURGERY  12/2015   Dr.  Patrice Paradise  . [place]  . IR RADIOLOGIST EVAL & MGMT  12/02/2016  . IR RADIOLOGIST EVAL & MGMT  03/18/2017  . IR RADIOLOGIST EVAL & MGMT  09/22/2017  . IR RADIOLOGIST EVAL & MGMT  09/06/2018  . KNEE SURGERY    . LITHOTRIPSY  2010  . PROSTATECTOMY  2009  . TONSILLECTOMY AND ADENOIDECTOMY      FAMILY HISTORY: Family History  Problem Relation Age of Onset  . Lung cancer Father 44  . Coronary artery disease Father   . Heart failure Mother   . Coronary artery disease Mother   . Hypertension Mother   . Hyperlipidemia Mother   . Heart attack Mother   . Diabetes type II Mother   . Multiple myeloma Son        1 son  . Uterine cancer Daughter        2 daughters total  . Hypertension Brother        3 brothers total  . Hyperlipidemia Brother   . Diabetes type II Brother   . Depression Brother   . Diabetes type II Sister        1sister  . Hyperthyroidism Sister   . Depression Sister   . Obesity Sister   . Depression Daughter     SOCIAL HISTORY: Social History   Socioeconomic History  . Marital status: Divorced    Spouse name: Not on file  . Number of children: 3  . Years of education: Not on file  . Highest education level: Not on file  Occupational History  . Occupation: RETAIL    Comment: [employer]  Tobacco Use  . Smoking status: Never Smoker  . Smokeless tobacco: Never Used  Substance and Sexual Activity  . Alcohol use: Yes    Alcohol/week: 0.0 standard drinks    Comment: occ  . Drug use: No  . Sexual activity: Not on file  Other Topics Concern  . Not on file  Social History Narrative   Right handed.  Married, 3 kids.  Caffeine 1 cups daily.  Retired.   Social Determinants of Health   Financial Resource Strain:   . Difficulty of Paying Living Expenses: Not on file  Food Insecurity:   . Worried About Charity fundraiser in the Last Year: Not on file  . Ran Out of Food in the Last Year: Not on file  Transportation Needs:   . Lack of  Transportation (Medical): Not on file  . Lack of Transportation (Non-Medical): Not on file  Physical Activity:   . Days of Exercise per Week: Not on file  . Minutes of Exercise per Session:  Not on file  Stress:   . Feeling of Stress : Not on file  Social Connections:   . Frequency of Communication with Friends and Family: Not on file  . Frequency of Social Gatherings with Friends and Family: Not on file  . Attends Religious Services: Not on file  . Active Member of Clubs or Organizations: Not on file  . Attends Archivist Meetings: Not on file  . Marital Status: Not on file  Intimate Partner Violence:   . Fear of Current or Ex-Partner: Not on file  . Emotionally Abused: Not on file  . Physically Abused: Not on file  . Sexually Abused: Not on file      PHYSICAL EXAM Generalized: Well developed, in no acute distress   Neurological examination  Mentation: Alert oriented to time, place, history taking. Follows all commands speech and language fluent Cranial nerve II-XII:Extraocular movements were full. Facial symmetry noted. uvula tongue midline. Head turning and shoulder shrug  were normal and symmetric. Motor: Good strength throughout subjectively per patient Sensory: Sensory testing is intact to soft touch on all 4 extremities subjectively per patient Coordination: Cerebellar testing reveals good finger-nose-finger  Gait and station: Patient is able to stand from a seated position. gait is normal.  Reflexes: UTA  DIAGNOSTIC DATA (LABS, IMAGING, TESTING) - I reviewed patient records, labs, notes, testing and imaging myself where available.  Lab Results  Component Value Date   WBC 6.1 04/07/2011   HGB 14.8 04/07/2011   HCT 42.0 04/07/2011   MCV 93.9 04/07/2011   PLT 50.0 (L) 04/07/2011      ASSESSMENT AND PLAN 69 y.o. year old male  has a past medical history of ADD (attention deficit disorder) without hyperactivity, Anxiety, Chronic insomnia, Depression,  Diverticulosis (1997), DM2 (diabetes mellitus, type 2) ([place]), Fibromyalgia, History of pancreatitis (1997), History of prostate cancer, HLD (hyperlipidemia), Hypercholesterolemia, Hypertension, Kidney stones, Neuropathy, OSA (obstructive sleep apnea), OSA on CPAP (01/15/2014), and Osteoarthritis. here with:  1. Obstructive sleep apnea on CPAP  The patient's CPAP download shows excellent compliance and good treatment of his apnea.  He is encouraged to continue using CPAP nightly and greater than 4 hours each night.  He is advised that if his symptoms worsen or he develops new symptoms he should let us know.  He will follow-up in 1 year or sooner if needed    I spent 15 minutes with the patient. 50% of this time was spent reviewing CPAP download   Ward Givens, MSN, NP-C 03/30/2019, 11:33 AM [facility] [address] [place], [place] [address] [phone]

## 2019-12-06 ENCOUNTER — Other Ambulatory Visit: Payer: Self-pay

## 2019-12-06 ENCOUNTER — Other Ambulatory Visit: Payer: Self-pay | Admitting: Interventional Radiology

## 2019-12-06 DIAGNOSIS — N2889 Other specified disorders of kidney and ureter: Secondary | ICD-10-CM

## 2020-03-28 ENCOUNTER — Telehealth: Payer: Medicare Other

## 2020-04-01 ENCOUNTER — Ambulatory Visit (INDEPENDENT_AMBULATORY_CARE_PROVIDER_SITE_OTHER): Payer: Medicare Other | Admitting: Adult Health

## 2020-04-01 VITALS — BP 130/62 | HR 71

## 2020-04-01 DIAGNOSIS — Z9989 Dependence on other enabling machines and devices: Secondary | ICD-10-CM | POA: Diagnosis not present

## 2020-04-01 DIAGNOSIS — G4733 Obstructive sleep apnea (adult) (pediatric): Secondary | ICD-10-CM

## 2020-04-01 NOTE — Progress Notes (Signed)
PATIENT: Bradley Medina DOB: December 28, 1950  REASON FOR VISIT: follow up HISTORY FROM: patient  HISTORY OF PRESENT ILLNESS: Today 04/01/20:  [name] is a 70 year old male with a history of obstructive sleep apnea on CPAP.  He returns today for follow-up.  He reports that the CPAP continues to work well for him.  He denies any new issues.  He returns today for an evaluation.  Compliance Report Usage 03/02/2020 - 03/31/2020 Usage days 30/30 days (100%) >= 4 hours 30 days (100%) Average usage (total days) 9 hours 16 minutes   AirSense 10 CPAP Serial number [id number] Mode CPAP Set pressure 12 cmH2O EPR Fulltime EPR level 3  Therapy Leaks - L/min 95th percentile: 15.4 Events per hour AHI: 2.1   HISTORY  03/30/19:  Bradley Medina is a 70 year old male with a history of obstructive sleep apnea on CPAP.  His download indicates that he uses machine 29 out of 30 days for compliance of 97%.  He uses machine greater than 4 hours 29 days for compliance of 97%.  On average he uses his machine 8 hours and 44 minutes.  His residual AHI is 1.6 on 12 cm of water with EPR of 3.  He does not have a significant leak.  He reports that the CPAP is working well for him.  He returns today for an evaluation.  REVIEW OF SYSTEMS: Out of a complete 14 system review of symptoms, the patient complains only of the following symptoms, and all other reviewed systems are negative.  ESS 8  ALLERGIES: Allergies  Allergen Reactions  . Aspirin     Chronically Low platelets    HOME MEDICATIONS: Outpatient Medications Prior to Visit  Medication Sig Dispense Refill  . ARIPiprazole (ABILIFY) 2 MG tablet Take by mouth.    . fenofibrate micronized (LOFIBRA) 200 MG capsule TAKE 1 CAP BY MOUTH IN THE MORNING BEFORE BREAKFAST    . fexofenadine (ALLEGRA) 180 MG tablet Take 180 mg by mouth as needed.     . gabapentin (NEURONTIN) 800 MG tablet Take 800 mg by mouth in the morning, at noon, in the evening, and at  bedtime.    . Insulin Glargine (BASAGLAR KWIKPEN) 100 UNIT/ML SOPN Inject 20 Units into the skin 2 (two) times daily.    Marland Kitchen losartan-hydrochlorothiazide (HYZAAR) 100-12.5 MG tablet Take 1 tablet by mouth daily.    . metFORMIN (GLUCOPHAGE) 500 MG tablet Take 500 mg by mouth 2 (two) times daily with a meal.     . pioglitazone (ACTOS) 45 MG tablet Take 45 mg by mouth daily.     . rosuvastatin (CRESTOR) 10 MG tablet Take 10 mg by mouth daily. Takes 1 tablet every other day.    . venlafaxine XR (EFFEXOR-XR) 150 MG 24 hr capsule Take 75 mg by mouth.     No facility-administered medications prior to visit.    PAST MEDICAL HISTORY: Past Medical History:  Diagnosis Date  . ADD (attention deficit disorder) without hyperactivity   . Anxiety   . Chronic insomnia   . Depression   . Diverticulosis 1997   history of  . DM2 (diabetes mellitus, type 2) ([place])   . Fibromyalgia   . History of pancreatitis 1997  . History of prostate cancer    AGE 25  . HLD (hyperlipidemia)   . Hypercholesterolemia   . Hypertension   . Kidney stones   . Neuropathy   . OSA (obstructive sleep apnea)   . OSA on CPAP 01/15/2014  .  Osteoarthritis     PAST SURGICAL HISTORY: Past Surgical History:  Procedure Laterality Date  . CARPAL TUNNEL RELEASE  1998  . CERVICAL SPINE SURGERY  12/2015   Dr. Patrice Paradise  .   . IR RADIOLOGIST EVAL & MGMT  12/02/2016  . IR RADIOLOGIST EVAL & MGMT  03/18/2017  . IR RADIOLOGIST EVAL & MGMT  09/22/2017  . IR RADIOLOGIST EVAL & MGMT  09/06/2018  . KNEE SURGERY    . LITHOTRIPSY  2010  . PROSTATECTOMY  2009  . TONSILLECTOMY AND ADENOIDECTOMY      FAMILY HISTORY: Family History  Problem Relation Age of Onset  . Lung cancer Father 72  . Coronary artery disease Father   . Heart failure Mother   . Coronary artery disease Mother   . Hypertension Mother   . Hyperlipidemia Mother   . Heart attack Mother   . Diabetes type II Mother   . Multiple myeloma Son         1 son  . Uterine cancer Daughter        2 daughters total  . Hypertension Brother        3 brothers total  . Hyperlipidemia Brother   . Diabetes type II Brother   . Depression Brother   . Diabetes type II Sister        1sister  . Hyperthyroidism Sister   . Depression Sister   . Obesity Sister   . Depression Daughter     SOCIAL HISTORY: Social History   Socioeconomic History  . Marital status: Divorced    Spouse name: Not on file  . Number of children: 3  . Years of education: Not on file  . Highest education level: Not on file  Occupational History  . Occupation: RETAIL    Comment: [employer]  Tobacco Use  . Smoking status: Never Smoker  . Smokeless tobacco: Never Used  Substance and Sexual Activity  . Alcohol use: Yes    Alcohol/week: 0.0 standard drinks    Comment: occ  . Drug use: No  . Sexual activity: Not on file  Other Topics Concern  . Not on file  Social History Narrative   Right handed.  Married, 3 kids.  Caffeine 1 cups daily.  Retired.   Social Determinants of Health   Financial Resource Strain: Not on file  Food Insecurity: Not on file  Transportation Needs: Not on file  Physical Activity: Not on file  Stress: Not on file  Social Connections: Not on file  Intimate Partner Violence: Not on file      PHYSICAL EXAM  Vitals:   04/01/20 1436  BP: 130/62  Pulse: 71   There is no height or weight on file to calculate BMI.  Generalized: Well developed, in no acute distress  Chest: Lungs clear to auscultation bilaterally  Neurological examination  Mentation: Alert oriented to time, place, history taking. Follows all commands speech and language fluent Cranial nerve II-XII: Extraocular movements were full, visual field were full on confrontational test Head turning and shoulder shrug  were normal and symmetric. Motor: The motor testing reveals 5 over 5 strength of all 4 extremities. Good symmetric motor tone is noted throughout.  Sensory:  Sensory testing is intact to soft touch on all 4 extremities. No evidence of extinction is noted.  Gait and station: Gait is normal.    DIAGNOSTIC DATA (LABS, IMAGING, TESTING) - I reviewed patient records, labs, notes, testing and imaging myself where available.  Lab Results  Component Value Date   WBC 6.1 04/07/2011   HGB 14.8 04/07/2011   HCT 42.0 04/07/2011   MCV 93.9 04/07/2011   PLT 50.0 (L) 04/07/2011      ASSESSMENT AND PLAN 70 y.o. year old male  has a past medical history of ADD (attention deficit disorder) without hyperactivity, Anxiety, Chronic insomnia, Depression, Diverticulosis (1997), DM2 (diabetes mellitus, type 2) ([place]), Fibromyalgia, History of pancreatitis (1997), History of prostate cancer, HLD (hyperlipidemia), Hypercholesterolemia, Hypertension, Kidney stones, Neuropathy, OSA (obstructive sleep apnea), OSA on CPAP (01/15/2014), and Osteoarthritis. here with:  1. OSA on CPAP  - CPAP compliance excellent - Good treatment of AHI  - Encourage patient to use CPAP nightly and > 4 hours each night - F/U in 1 year or sooner if needed   I spent 20 minutes of face-to-face and non-face-to-face time with patient.  This included previsit chart review, lab review, study review, order entry, electronic health record documentation, patient education.  Ward Givens, MSN, NP-C 04/01/2020, 2:46 PM [facility] [address], [place], [place] [address] [phone]

## 2020-04-01 NOTE — Patient Instructions (Signed)
Continue using CPAP nightly and greater than 4 hours each night °If your symptoms worsen or you develop new symptoms please let us know.  ° °

## 2020-04-08 ENCOUNTER — Telehealth: Payer: Self-pay | Admitting: Adult Health

## 2020-04-08 NOTE — Telephone Encounter (Signed)
Orders sent to [facility]. 

## 2020-04-08 NOTE — Telephone Encounter (Signed)
Pt states he was told to call if he had not heard anything about his CPAP mask, he is still waiting to be contacted.

## 2020-04-25 ENCOUNTER — Ambulatory Visit
Admission: RE | Admit: 2020-04-25 | Discharge: 2020-04-25 | Disposition: A | Discharge status: Home / Self Care | Payer: Medicare Other | Source: Ambulatory Visit | Attending: Interventional Radiology | Admitting: Interventional Radiology

## 2020-04-25 ENCOUNTER — Other Ambulatory Visit: Payer: Self-pay

## 2020-04-25 ENCOUNTER — Encounter: Payer: Self-pay | Admitting: *Deleted

## 2020-04-25 DIAGNOSIS — N2889 Other specified disorders of kidney and ureter: Secondary | ICD-10-CM

## 2020-04-25 HISTORY — PX: IR RADIOLOGIST EVAL & MGMT: IMG5224

## 2020-04-25 NOTE — Progress Notes (Signed)
Patient ID: Bradley Medina, male   DOB: May 18, 1950, 70 y.o.   MRN: [id number]         Chief Complaint: Post left-sided renal cryoablation (performed December 17, 2016)  Referring Physician(s): [place]  History of Present Illness: Bradley Medina is a 70 y.o. 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  Patient is seen today in video consult following the acquisition of surveillance abdominal MRI performed 04/03/2020.  Patient remains without complaint in regards to his left-sided renal lesion.    Past Medical History:  Diagnosis Date  . ADD (attention deficit disorder) without hyperactivity   . Anxiety   . Chronic insomnia   . Depression   . Diverticulosis 1997   history of  . DM2 (diabetes mellitus, type 2) ([place])   . Fibromyalgia   . History of pancreatitis 1997  . History of prostate cancer    AGE 68  . HLD (hyperlipidemia)   . Hypercholesterolemia   . Hypertension   . Kidney stones   . Neuropathy   . OSA (obstructive sleep apnea)   . OSA on CPAP 01/15/2014  . Osteoarthritis     Past Surgical History:  Procedure Laterality Date  . CARPAL TUNNEL RELEASE  1998  . CERVICAL SPINE SURGERY  12/2015   Dr. Patrice Paradise  . [place]  . IR RADIOLOGIST EVAL & MGMT  12/02/2016  . IR RADIOLOGIST EVAL & MGMT  03/18/2017  . IR RADIOLOGIST EVAL & MGMT  09/22/2017  . IR RADIOLOGIST EVAL & MGMT  09/06/2018  . KNEE SURGERY    . LITHOTRIPSY  2010  . PROSTATECTOMY  2009  . TONSILLECTOMY AND ADENOIDECTOMY      Allergies: Aspirin  Medications: Prior to Admission medications   Medication Sig Start Date End Date Taking? Authorizing Provider  ARIPiprazole (ABILIFY) 2 MG tablet Take by mouth. 03/19/20   [provider]  fenofibrate micronized (LOFIBRA) 200 MG capsule TAKE 1 CAP BY MOUTH IN THE MORNING BEFORE BREAKFAST 07/06/16   [provider]  fexofenadine (ALLEGRA) 180 MG tablet Take 180 mg by mouth as needed.     [provider]  gabapentin (NEURONTIN) 800 MG tablet Take 800 mg by mouth in the morning, at noon, in the evening, and at bedtime. 09/16/17   [provider]  Insulin Glargine (BASAGLAR KWIKPEN) 100 UNIT/ML SOPN Inject 20 Units into the skin 2 (two) times daily.    [provider]  losartan-hydrochlorothiazide (HYZAAR) 100-12.5 MG tablet Take 1 tablet by mouth daily.    [provider]  metFORMIN (GLUCOPHAGE) 500 MG tablet Take 500 mg by mouth 2 (two) times daily with a meal.     [provider]  pioglitazone (ACTOS) 45 MG tablet Take 45 mg by mouth daily.     [provider]  rosuvastatin (CRESTOR) 10 MG tablet Take 10 mg by mouth daily. Takes 1 tablet every other day.    [provider]  venlafaxine XR (EFFEXOR-XR) 150 MG 24 hr capsule Take 75 mg by mouth. 01/01/14   [provider]     Family History  Problem Relation Age of Onset  . Lung cancer Father 56  . Coronary artery disease Father   . Heart failure Mother   . Coronary artery disease Mother   . Hypertension Mother   .  Hyperlipidemia Mother   . Heart attack Mother   . Diabetes type II Mother   . Multiple myeloma Son        1 son  . Uterine cancer Daughter        2 daughters total  . Hypertension Brother        3 brothers total  . Hyperlipidemia Brother   . Diabetes type II Brother   . Depression Brother   . Diabetes type II Sister        1sister  . Hyperthyroidism Sister   . Depression Sister   . Obesity Sister   . Depression Daughter     Social History   Socioeconomic History  . Marital status: Divorced    Spouse name: Not on file  . Number of children: 3  . Years of education: Not on file  . Highest education level: Not  on file  Occupational History  . Occupation: RETAIL    Comment: [employer]  Tobacco Use  . Smoking status: Never Smoker  . Smokeless tobacco: Never Used  Substance and Sexual Activity  . Alcohol use: Yes    Alcohol/week: 0.0 standard drinks    Comment: occ  . Drug use: No  . Sexual activity: Not on file  Other Topics Concern  . Not on file  Social History Narrative   Right handed.  Married, 3 kids.  Caffeine 1 cups daily.  Retired.   Social Determinants of Health   Financial Resource Strain: Not on file  Food Insecurity: Not on file  Transportation Needs: Not on file  Physical Activity: Not on file  Stress: Not on file  Social Connections: Not on file    ECOG Status: 1 - Symptomatic but completely ambulatory  Review of Systems  Review of Systems: A 12 point ROS discussed and pertinent positives are indicated in the HPI above.  All other systems are negative.  Physical Exam No direct physical exam was performed (except for noted visual exam findings with Video Visits).   Vital Signs: There were no vitals taken for this visit.  Imaging:  Following examinations were reviewed:  Abdominal MRI - 04/02/2020; 08/29/2018; 09/21/2017; 03/15/2017; 1015/2018 CT-guided left renal cryoablation and biopsy - 01/03/2017  Abdominal MRI performed 04/03/2020 demonstrates stable appearance of the left renal cryoablation site without evidence of residual enhancement to suggest residual or locally recurrent disease.  Labs:  CBC: No results for input(s): WBC, HGB, HCT, PLT in the last 8760 hours.  COAGS: No results for input(s): INR, APTT in the last 8760 hours.  BMP: No results for input(s): NA, K, CL, CO2, GLUCOSE, BUN, CALCIUM, CREATININE, GFRNONAA, GFRAA in the last 8760 hours.  Invalid input(s): CMP  LIVER FUNCTION TESTS: No results for input(s): BILITOT, AST, ALT, ALKPHOS, PROT, ALBUMIN in the last 8760 hours.  TUMOR MARKERS: No results for input(s): AFPTM, CEA, CA199,  CHROMGRNA in the last 8760 hours.  Assessment and Plan:  Bradley Medina is a 70 y.o. 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  Patient is seen today in video consult following the acquisition of surveillance abdominal MRI performed 04/03/2020.  Patient remains without complaint in regards to his left-sided renal lesion.    Following examinations were reviewed:  Abdominal MRI - 04/02/2020; 08/29/2018; 09/21/2017; 03/15/2017; 1015/2018 CT-guided left renal cryoablation and biopsy - 01/03/2017  Abdominal MRI performed 04/03/2020 demonstrates stable appearance of the left renal cryoablation site without evidence of residual enhancement to suggest residual or locally recurrent disease.  This examination documents over 3 years of stability and as such imaging findings compatible with a procedural cure.  As such, continued dedicated follow-up will NOT be performed.  The patient demonstrated excellent understanding of the conversation and is agreement with the proposed plan of care.  Plan: - The patient may follow-up at the interventional radiology clinic on a as needed basis though no additional dedicated surveillance imaging is required for the treated left-sided renal lesion  Thank you for this interesting consult.  I greatly enjoyed meeting Bradley Medina and look forward to participating in their care.  A copy of this report was sent to the requesting provider on this date.  Electronically Signed: Sandi Mariscal 04/25/2020, 10:31 AM   I spent a total of 5 Minutes in remote  clinical consultation, greater than 50% of which was counseling/coordinating care for post left-sided renal cryoablation and biopsy.    Visit type: Audio only  (telephone). Audio (no video) only due to patient's lack of internet/smartphone capability. Alternative for in-person consultation at [facility], [address], [place], [place]. This visit type was conducted due to national recommendations for restrictions regarding the COVID-19 Pandemic (e.g. social distancing).  This format is felt to be most appropriate for this patient at this time.  All issues noted in this document were discussed and addressed.

## 2020-05-14 ENCOUNTER — Telehealth: Payer: Self-pay | Admitting: Adult Health

## 2020-05-14 DIAGNOSIS — G4733 Obstructive sleep apnea (adult) (pediatric): Secondary | ICD-10-CM

## 2020-05-14 NOTE — Telephone Encounter (Signed)
Called pt, LMVM for him if he know how old machine was, if not DME would have that information.  If not over 5 yrs, repair machine??

## 2020-05-14 NOTE — Telephone Encounter (Signed)
Pt called stating that his cpap machine went out on him last night and he would like to know how he can go about getting it replaced. Please advise.

## 2020-05-15 NOTE — Telephone Encounter (Signed)
I called pt and LM 2nd call re: his cpap machine.  [facility] is where is has gotten his machine.

## 2020-05-15 NOTE — Addendum Note (Signed)
Addended by: Brandon Melnick on: 05/15/2020 04:05 PM   Modules accepted: Orders

## 2020-05-15 NOTE — Telephone Encounter (Signed)
Spoke to pt and he is asking for new machine since his other has bit the dust (went out on him).  It is 71 years old.  DME is [facility].  Needs order.  Last seen 04-01-20.

## 2020-05-16 NOTE — Addendum Note (Signed)
Addended by: Trudie Buckler on: 05/16/2020 08:49 AM   Modules accepted: Orders

## 2020-05-16 NOTE — Telephone Encounter (Signed)
  We will get this taken care of. Janett Billow I will have Lattie Haw put this one in and assign to you to work.   Thanks!  Margreta Journey

## 2020-05-16 NOTE — Telephone Encounter (Signed)
LMVm for pt that did get order and sent message to [facility] for this.  He should hear from them, but if not in a week to call them.  He is to call back if questions.

## 2020-05-16 NOTE — Telephone Encounter (Signed)
Order placed

## 2020-09-30 ENCOUNTER — Ambulatory Visit: Payer: Medicare Other | Attending: Internal Medicine

## 2020-09-30 DIAGNOSIS — Z23 Encounter for immunization: Secondary | ICD-10-CM

## 2020-09-30 NOTE — Progress Notes (Signed)
   Covid-19 Vaccination Clinic  Name:  Bradley Medina    MRN: [id number] DOB: 12-Aug-1950  09/30/2020  [name] was observed post Covid-19 immunization for 15 minutes without incident. He was provided with Vaccine Information Sheet and instruction to access the V-Safe system.   [name] was instructed to call 911 with any severe reactions post vaccine: Difficulty breathing  Swelling of face and throat  A fast heartbeat  A bad rash all over body  Dizziness and weakness   Immunizations Administered     Name Date Dose VIS Date Route   PFIZER Comrnaty(Gray TOP) Covid-19 Vaccine 09/30/2020 12:25 PM 0.3 mL 01/25/2020 Intramuscular   Manufacturer: [employer]   Lot: [id number]   NDC: [phone]

## 2020-10-07 ENCOUNTER — Other Ambulatory Visit (HOSPITAL_BASED_OUTPATIENT_CLINIC_OR_DEPARTMENT_OTHER): Payer: Self-pay

## 2020-10-07 MED ORDER — COVID-19 MRNA VAC-TRIS(PFIZER) 30 MCG/0.3ML IM SUSP
INTRAMUSCULAR | 0 refills | Status: AC
  Filled 2020-10-07: qty 0.3, 1d supply, fill #0

## 2021-03-31 ENCOUNTER — Encounter: Payer: Self-pay | Admitting: Adult Health

## 2021-04-02 ENCOUNTER — Ambulatory Visit: Payer: Medicare Other | Admitting: Adult Health
